# Patient Record
Sex: Male | Born: 1980 | Race: Black or African American | Hispanic: No | Marital: Single | Smoking: Former smoker
Health system: Southern US, Community
[De-identification: ages and names within clinical notes are randomized; demographics above are authoritative.]

## PROBLEM LIST (undated history)

## (undated) DIAGNOSIS — L0292 Furuncle, unspecified: Secondary | ICD-10-CM

## (undated) DIAGNOSIS — Z21 Asymptomatic human immunodeficiency virus [HIV] infection status: Secondary | ICD-10-CM

## (undated) DIAGNOSIS — R7989 Other specified abnormal findings of blood chemistry: Secondary | ICD-10-CM

## (undated) DIAGNOSIS — I10 Essential (primary) hypertension: Secondary | ICD-10-CM

## (undated) DIAGNOSIS — B20 Human immunodeficiency virus [HIV] disease: Secondary | ICD-10-CM

## (undated) HISTORY — PX: I & D EXTREMITY: SHX5045

## (undated) HISTORY — DX: Essential (primary) hypertension: I10

## (undated) HISTORY — DX: Other specified abnormal findings of blood chemistry: R79.89

## (undated) HISTORY — DX: Asymptomatic human immunodeficiency virus (hiv) infection status: Z21

## (undated) HISTORY — DX: Furuncle, unspecified: L02.92

## (undated) HISTORY — DX: Human immunodeficiency virus (HIV) disease: B20

---

## 2000-10-26 ENCOUNTER — Emergency Department (HOSPITAL_COMMUNITY): Admission: EM | Admit: 2000-10-26 | Discharge: 2000-10-26 | Payer: Self-pay | Admitting: Emergency Medicine

## 2002-06-08 ENCOUNTER — Emergency Department (HOSPITAL_COMMUNITY): Admission: EM | Admit: 2002-06-08 | Discharge: 2002-06-08 | Payer: Self-pay | Admitting: Emergency Medicine

## 2003-12-11 ENCOUNTER — Ambulatory Visit: Payer: Self-pay | Admitting: Infectious Diseases

## 2003-12-11 ENCOUNTER — Ambulatory Visit (HOSPITAL_COMMUNITY): Admission: RE | Admit: 2003-12-11 | Discharge: 2003-12-11 | Payer: Self-pay | Admitting: Infectious Diseases

## 2003-12-11 ENCOUNTER — Encounter (INDEPENDENT_AMBULATORY_CARE_PROVIDER_SITE_OTHER): Payer: Self-pay | Admitting: *Deleted

## 2003-12-11 LAB — CONVERTED CEMR LAB
CD4 Count: 370 microliters
CD4 T Cell Abs: 370

## 2003-12-27 ENCOUNTER — Ambulatory Visit: Payer: Self-pay | Admitting: Infectious Diseases

## 2004-01-03 ENCOUNTER — Ambulatory Visit: Payer: Self-pay | Admitting: Infectious Diseases

## 2004-01-09 ENCOUNTER — Ambulatory Visit: Payer: Self-pay | Admitting: Infectious Diseases

## 2004-09-25 ENCOUNTER — Ambulatory Visit: Payer: Self-pay | Admitting: Infectious Diseases

## 2004-09-25 ENCOUNTER — Ambulatory Visit (HOSPITAL_COMMUNITY): Admission: RE | Admit: 2004-09-25 | Discharge: 2004-09-25 | Payer: Self-pay | Admitting: Infectious Diseases

## 2004-11-04 ENCOUNTER — Ambulatory Visit: Payer: Self-pay | Admitting: Infectious Diseases

## 2005-01-28 ENCOUNTER — Ambulatory Visit: Payer: Self-pay | Admitting: Infectious Diseases

## 2005-01-28 ENCOUNTER — Ambulatory Visit (HOSPITAL_COMMUNITY): Admission: RE | Admit: 2005-01-28 | Discharge: 2005-01-28 | Payer: Self-pay | Admitting: Infectious Diseases

## 2005-06-04 ENCOUNTER — Ambulatory Visit: Payer: Self-pay | Admitting: Infectious Diseases

## 2005-08-18 ENCOUNTER — Encounter (INDEPENDENT_AMBULATORY_CARE_PROVIDER_SITE_OTHER): Payer: Self-pay | Admitting: *Deleted

## 2005-08-18 ENCOUNTER — Emergency Department (HOSPITAL_COMMUNITY): Admission: EM | Admit: 2005-08-18 | Discharge: 2005-08-18 | Payer: Self-pay | Admitting: Family Medicine

## 2005-08-18 ENCOUNTER — Ambulatory Visit: Payer: Self-pay | Admitting: Infectious Diseases

## 2005-08-18 ENCOUNTER — Encounter: Admission: RE | Admit: 2005-08-18 | Discharge: 2005-08-18 | Payer: Self-pay | Admitting: Infectious Diseases

## 2005-08-18 LAB — CONVERTED CEMR LAB
CD4 Count: 800 microliters
HIV 1 RNA Quant: 5970 copies/mL

## 2005-11-27 ENCOUNTER — Ambulatory Visit: Payer: Self-pay | Admitting: Infectious Diseases

## 2005-11-27 ENCOUNTER — Encounter (INDEPENDENT_AMBULATORY_CARE_PROVIDER_SITE_OTHER): Payer: Self-pay | Admitting: *Deleted

## 2005-11-27 ENCOUNTER — Encounter: Admission: RE | Admit: 2005-11-27 | Discharge: 2005-11-27 | Payer: Self-pay | Admitting: Infectious Diseases

## 2005-11-27 LAB — CONVERTED CEMR LAB: HIV 1 RNA Quant: 264 copies/mL

## 2006-02-18 ENCOUNTER — Ambulatory Visit: Payer: Self-pay | Admitting: Infectious Diseases

## 2006-03-10 DIAGNOSIS — B20 Human immunodeficiency virus [HIV] disease: Secondary | ICD-10-CM

## 2006-03-29 ENCOUNTER — Encounter: Payer: Self-pay | Admitting: Infectious Diseases

## 2006-04-07 ENCOUNTER — Ambulatory Visit: Payer: Self-pay | Admitting: Infectious Diseases

## 2006-04-07 ENCOUNTER — Encounter: Admission: RE | Admit: 2006-04-07 | Discharge: 2006-04-07 | Payer: Self-pay | Admitting: Infectious Diseases

## 2006-04-07 ENCOUNTER — Encounter (INDEPENDENT_AMBULATORY_CARE_PROVIDER_SITE_OTHER): Payer: Self-pay | Admitting: *Deleted

## 2006-04-07 LAB — CONVERTED CEMR LAB
ALT: 19 units/L (ref 0–53)
Alkaline Phosphatase: 51 units/L (ref 39–117)
Basophils Absolute: 0 10*3/uL (ref 0.0–0.1)
Basophils Relative: 0 % (ref 0–1)
CD4 Count: 700 microliters
Chloride: 102 meq/L (ref 96–112)
Creatinine, Ser: 0.95 mg/dL (ref 0.40–1.50)
Eosinophils Absolute: 0.2 10*3/uL (ref 0.0–0.7)
Eosinophils Relative: 2 % (ref 0–5)
Glucose, Bld: 82 mg/dL (ref 70–99)
HCT: 41.4 % (ref 39.0–52.0)
HIV 1 RNA Quant: 345 copies/mL
HIV-1 RNA Quant, Log: 2.54 — ABNORMAL HIGH (ref <1.70)
Lymphocytes Relative: 29 % (ref 12–46)
Lymphs Abs: 3 10*3/uL (ref 0.7–3.3)
MCHC: 35.7 g/dL (ref 30.0–36.0)
Neutro Abs: 6.3 10*3/uL (ref 1.7–7.7)
Potassium: 4.1 meq/L (ref 3.5–5.3)
Total Protein: 8 g/dL (ref 6.0–8.3)

## 2006-04-26 ENCOUNTER — Encounter (INDEPENDENT_AMBULATORY_CARE_PROVIDER_SITE_OTHER): Payer: Self-pay | Admitting: *Deleted

## 2006-04-26 LAB — CONVERTED CEMR LAB

## 2006-05-05 ENCOUNTER — Emergency Department (HOSPITAL_COMMUNITY): Admission: EM | Admit: 2006-05-05 | Discharge: 2006-05-05 | Payer: Self-pay | Admitting: Emergency Medicine

## 2006-05-09 ENCOUNTER — Encounter (INDEPENDENT_AMBULATORY_CARE_PROVIDER_SITE_OTHER): Payer: Self-pay | Admitting: *Deleted

## 2007-03-01 ENCOUNTER — Encounter: Payer: Self-pay | Admitting: Infectious Diseases

## 2007-03-09 ENCOUNTER — Telehealth: Payer: Self-pay | Admitting: Infectious Diseases

## 2007-04-04 ENCOUNTER — Encounter: Admission: RE | Admit: 2007-04-04 | Discharge: 2007-04-04 | Payer: Self-pay | Admitting: Internal Medicine

## 2007-04-04 ENCOUNTER — Ambulatory Visit: Payer: Self-pay | Admitting: Internal Medicine

## 2007-04-04 LAB — CONVERTED CEMR LAB
AST: 16 units/L (ref 0–37)
Albumin: 4.5 g/dL (ref 3.5–5.2)
BUN: 9 mg/dL (ref 6–23)
Basophils Absolute: 0 10*3/uL (ref 0.0–0.1)
Basophils Relative: 0 % (ref 0–1)
CO2: 24 meq/L (ref 19–32)
HIV 1 RNA Quant: 667 copies/mL — ABNORMAL HIGH (ref <50)
Lymphs Abs: 4.1 10*3/uL — ABNORMAL HIGH (ref 0.7–4.0)
MCHC: 36.3 g/dL — ABNORMAL HIGH (ref 30.0–36.0)
Monocytes Absolute: 0.6 10*3/uL (ref 0.1–1.0)
Neutrophils Relative %: 46 % (ref 43–77)
Potassium: 4.2 meq/L (ref 3.5–5.3)
RBC: 5.33 M/uL (ref 4.22–5.81)
Total Protein: 8.2 g/dL (ref 6.0–8.3)

## 2007-04-15 ENCOUNTER — Encounter (INDEPENDENT_AMBULATORY_CARE_PROVIDER_SITE_OTHER): Payer: Self-pay | Admitting: *Deleted

## 2007-04-29 ENCOUNTER — Ambulatory Visit: Payer: Self-pay | Admitting: Internal Medicine

## 2007-04-29 DIAGNOSIS — R21 Rash and other nonspecific skin eruption: Secondary | ICD-10-CM

## 2007-10-25 ENCOUNTER — Emergency Department (HOSPITAL_COMMUNITY): Admission: EM | Admit: 2007-10-25 | Discharge: 2007-10-25 | Payer: Self-pay | Admitting: Emergency Medicine

## 2007-10-25 ENCOUNTER — Telehealth (INDEPENDENT_AMBULATORY_CARE_PROVIDER_SITE_OTHER): Payer: Self-pay | Admitting: *Deleted

## 2007-10-28 ENCOUNTER — Ambulatory Visit: Payer: Self-pay | Admitting: Internal Medicine

## 2007-10-28 ENCOUNTER — Telehealth (INDEPENDENT_AMBULATORY_CARE_PROVIDER_SITE_OTHER): Payer: Self-pay | Admitting: *Deleted

## 2007-10-28 ENCOUNTER — Encounter (INDEPENDENT_AMBULATORY_CARE_PROVIDER_SITE_OTHER): Payer: Self-pay | Admitting: Licensed Clinical Social Worker

## 2007-10-28 DIAGNOSIS — A5139 Other secondary syphilis of skin: Secondary | ICD-10-CM

## 2007-10-28 LAB — CONVERTED CEMR LAB: HIV 1 RNA Quant: 4200 copies/mL — ABNORMAL HIGH (ref <50)

## 2007-10-31 ENCOUNTER — Telehealth: Payer: Self-pay

## 2007-11-01 ENCOUNTER — Telehealth: Payer: Self-pay | Admitting: Internal Medicine

## 2007-11-09 LAB — CONVERTED CEMR LAB
ALT: 19 units/L (ref 0–53)
Albumin: 3.7 g/dL (ref 3.5–5.2)
Alkaline Phosphatase: 61 units/L (ref 39–117)
Basophils Relative: 0 % (ref 0–1)
HCT: 41.7 % (ref 39.0–52.0)
Hemoglobin: 14 g/dL (ref 13.0–17.0)
MCV: 82.2 fL (ref 78.0–100.0)
Monocytes Relative: 11 % (ref 3–12)
Neutro Abs: 11.8 10*3/uL — ABNORMAL HIGH (ref 1.7–7.7)
Neutrophils Relative %: 64 % (ref 43–77)
Potassium: 3.9 meq/L (ref 3.5–5.3)
RBC: 5.07 M/uL (ref 4.22–5.81)
RDW: 13.6 % (ref 11.5–15.5)
Total Protein: 8.3 g/dL (ref 6.0–8.3)
WBC: 18.6 10*3/uL — ABNORMAL HIGH (ref 4.0–10.5)

## 2007-11-15 ENCOUNTER — Telehealth: Payer: Self-pay | Admitting: Internal Medicine

## 2007-11-15 ENCOUNTER — Telehealth: Payer: Self-pay

## 2007-11-16 ENCOUNTER — Encounter: Payer: Self-pay | Admitting: Internal Medicine

## 2007-11-18 ENCOUNTER — Telehealth: Payer: Self-pay | Admitting: Internal Medicine

## 2007-11-18 ENCOUNTER — Ambulatory Visit: Payer: Self-pay | Admitting: Internal Medicine

## 2007-11-18 DIAGNOSIS — F411 Generalized anxiety disorder: Secondary | ICD-10-CM | POA: Insufficient documentation

## 2007-11-22 ENCOUNTER — Telehealth (INDEPENDENT_AMBULATORY_CARE_PROVIDER_SITE_OTHER): Payer: Self-pay | Admitting: *Deleted

## 2007-11-24 ENCOUNTER — Telehealth (INDEPENDENT_AMBULATORY_CARE_PROVIDER_SITE_OTHER): Payer: Self-pay | Admitting: *Deleted

## 2007-11-24 ENCOUNTER — Encounter: Payer: Self-pay | Admitting: Internal Medicine

## 2007-12-01 ENCOUNTER — Telehealth (INDEPENDENT_AMBULATORY_CARE_PROVIDER_SITE_OTHER): Payer: Self-pay | Admitting: *Deleted

## 2007-12-06 ENCOUNTER — Telehealth: Payer: Self-pay

## 2008-02-17 ENCOUNTER — Ambulatory Visit: Payer: Self-pay | Admitting: Internal Medicine

## 2008-02-17 DIAGNOSIS — R197 Diarrhea, unspecified: Secondary | ICD-10-CM

## 2008-02-17 LAB — CONVERTED CEMR LAB
AST: 27 units/L (ref 0–37)
Albumin: 4.5 g/dL (ref 3.5–5.2)
Alkaline Phosphatase: 82 units/L (ref 39–117)
BUN: 12 mg/dL (ref 6–23)
Basophils Absolute: 0 10*3/uL (ref 0.0–0.1)
Basophils Relative: 0 % (ref 0–1)
Calcium: 9.5 mg/dL (ref 8.4–10.5)
Chloride: 102 meq/L (ref 96–112)
Creatinine, Ser: 1.04 mg/dL (ref 0.40–1.50)
Eosinophils Absolute: 0.2 10*3/uL (ref 0.0–0.7)
Glucose, Bld: 101 mg/dL — ABNORMAL HIGH (ref 70–99)
HCT: 43.6 % (ref 39.0–52.0)
HIV 1 RNA Quant: 1140 copies/mL — ABNORMAL HIGH (ref <48)
HIV-1 RNA Quant, Log: 3.06 — ABNORMAL HIGH (ref <1.68)
Monocytes Absolute: 0.9 10*3/uL (ref 0.1–1.0)
Potassium: 4.6 meq/L (ref 3.5–5.3)
WBC: 12.2 10*3/uL — ABNORMAL HIGH (ref 4.0–10.5)

## 2008-07-18 ENCOUNTER — Telehealth (INDEPENDENT_AMBULATORY_CARE_PROVIDER_SITE_OTHER): Payer: Self-pay | Admitting: *Deleted

## 2008-07-23 ENCOUNTER — Ambulatory Visit: Payer: Self-pay | Admitting: Internal Medicine

## 2008-07-23 LAB — CONVERTED CEMR LAB
BUN: 9 mg/dL (ref 6–23)
CO2: 23 meq/L (ref 19–32)
Calcium: 10.1 mg/dL (ref 8.4–10.5)
Chloride: 99 meq/L (ref 96–112)
GFR calc Af Amer: >60 mL/min (ref >60)
GFR calc non Af Amer: >60 mL/min (ref >60)
HCT: 42.6 % (ref 39.0–52.0)
Lymphs Abs: 4.2 10*3/uL — ABNORMAL HIGH (ref 0.7–4.0)
MCHC: 35.4 g/dL (ref 30.0–36.0)
MCV: 76.1 fL — ABNORMAL LOW (ref 78.0–100.0)
Monocytes Absolute: 0.9 10*3/uL (ref 0.1–1.0)
Potassium: 4.3 meq/L (ref 3.5–5.3)
RBC: 5.6 M/uL (ref 4.22–5.81)

## 2008-08-10 ENCOUNTER — Encounter: Payer: Self-pay | Admitting: Licensed Clinical Social Worker

## 2008-08-10 ENCOUNTER — Ambulatory Visit: Payer: Self-pay | Admitting: Internal Medicine

## 2008-08-10 DIAGNOSIS — I10 Essential (primary) hypertension: Secondary | ICD-10-CM | POA: Insufficient documentation

## 2009-02-06 ENCOUNTER — Ambulatory Visit: Payer: Self-pay | Admitting: Internal Medicine

## 2009-02-06 LAB — CONVERTED CEMR LAB
ALT: 14 units/L (ref 0–53)
BUN: 12 mg/dL (ref 6–23)
Basophils Absolute: 0 10*3/uL (ref 0.0–0.1)
Chloride: 100 meq/L (ref 96–112)
Creatinine, Ser: 0.99 mg/dL (ref 0.40–1.50)
Glucose, Bld: 85 mg/dL (ref 70–99)
HIV-1 RNA Quant, Log: 2.38 — ABNORMAL HIGH (ref <1.68)
Monocytes Absolute: 0.4 10*3/uL (ref 0.1–1.0)
Neutro Abs: 6.3 10*3/uL (ref 1.7–7.7)
Potassium: 4.1 meq/L (ref 3.5–5.3)
RDW: 14.3 % (ref 11.5–15.5)
Total Bilirubin: 0.4 mg/dL (ref 0.3–1.2)
WBC: 12.5 10*3/uL — ABNORMAL HIGH (ref 4.0–10.5)

## 2009-02-19 ENCOUNTER — Ambulatory Visit: Payer: Self-pay | Admitting: Internal Medicine

## 2010-06-03 LAB — T-HELPER CELL (CD4) - (RCID CLINIC ONLY)
CD4 % Helper T Cell: 25 % — ABNORMAL LOW (ref 33–55)
CD4 T Cell Abs: 1100 uL (ref 400–2700)

## 2010-06-10 LAB — T-HELPER CELL (CD4) - (RCID CLINIC ONLY)
CD4 % Helper T Cell: 26 % — ABNORMAL LOW (ref 33–55)
CD4 T Cell Abs: 1360 uL (ref 400–2700)

## 2010-11-18 ENCOUNTER — Telehealth: Payer: Self-pay | Admitting: *Deleted

## 2010-11-18 NOTE — Telephone Encounter (Signed)
I lm for him to call back & press 2 to get the staff who will make him an appt for labs, Britta Mccreedy & md

## 2010-11-21 LAB — T-HELPER CELL (CD4) - (RCID CLINIC ONLY)
CD4 % Helper T Cell: 24 — ABNORMAL LOW
CD4 T Cell Abs: 940

## 2010-11-24 ENCOUNTER — Other Ambulatory Visit (INDEPENDENT_AMBULATORY_CARE_PROVIDER_SITE_OTHER): Payer: Self-pay

## 2010-11-24 DIAGNOSIS — B20 Human immunodeficiency virus [HIV] disease: Secondary | ICD-10-CM

## 2010-11-24 DIAGNOSIS — Z113 Encounter for screening for infections with a predominantly sexual mode of transmission: Secondary | ICD-10-CM

## 2010-11-24 DIAGNOSIS — I10 Essential (primary) hypertension: Secondary | ICD-10-CM

## 2010-11-24 LAB — COMPREHENSIVE METABOLIC PANEL
ALT: 20 U/L (ref 0–53)
AST: 25 U/L (ref 0–37)
Albumin: 4.5 g/dL (ref 3.5–5.2)
Total Protein: 8.5 g/dL — ABNORMAL HIGH (ref 6.0–8.3)

## 2010-11-24 LAB — URINALYSIS, ROUTINE W REFLEX MICROSCOPIC
Ketones, ur: NEGATIVE mg/dL
Protein, ur: NEGATIVE mg/dL
Specific Gravity, Urine: 1.015 (ref 1.005–1.030)
Urobilinogen, UA: 0.2 mg/dL (ref 0.0–1.0)
pH: 6.5 (ref 5.0–8.0)

## 2010-11-24 LAB — CBC WITH DIFFERENTIAL/PLATELET
Basophils Relative: 1 % (ref 0–1)
Eosinophils Relative: 13 % — ABNORMAL HIGH (ref 0–5)
HCT: 42.8 % (ref 39.0–52.0)
MCH: 27.9 pg (ref 26.0–34.0)
MCV: 77 fL — ABNORMAL LOW (ref 78.0–100.0)
Monocytes Relative: 5 % (ref 3–12)
Neutrophils Relative %: 42 % — ABNORMAL LOW (ref 43–77)
RBC: 5.56 MIL/uL (ref 4.22–5.81)
WBC: 12.5 10*3/uL — ABNORMAL HIGH (ref 4.0–10.5)

## 2010-11-26 LAB — HIV-1 RNA QUANT-NO REFLEX-BLD
HIV 1 RNA Quant: 865 copies/mL — ABNORMAL HIGH (ref <20)
HIV-1 RNA Quant, Log: 2.94 {Log} — ABNORMAL HIGH (ref <1.30)

## 2010-12-05 LAB — T-HELPER CELL (CD4) - (RCID CLINIC ONLY): CD4 T Cell Abs: 1090 uL (ref 400–2700)

## 2010-12-08 ENCOUNTER — Encounter: Payer: Self-pay | Admitting: Infectious Disease

## 2010-12-08 ENCOUNTER — Ambulatory Visit (INDEPENDENT_AMBULATORY_CARE_PROVIDER_SITE_OTHER): Payer: PRIVATE HEALTH INSURANCE | Admitting: Infectious Disease

## 2010-12-08 VITALS — BP 153/101 | HR 81 | Temp 97.5°F | Wt 184.0 lb

## 2010-12-08 DIAGNOSIS — B20 Human immunodeficiency virus [HIV] disease: Secondary | ICD-10-CM

## 2010-12-08 DIAGNOSIS — Z23 Encounter for immunization: Secondary | ICD-10-CM

## 2010-12-08 DIAGNOSIS — A5139 Other secondary syphilis of skin: Secondary | ICD-10-CM

## 2010-12-08 DIAGNOSIS — I10 Essential (primary) hypertension: Secondary | ICD-10-CM

## 2010-12-08 MED ORDER — HYDROCHLOROTHIAZIDE 25 MG PO TABS: 25.0000 mg | ORAL_TABLET | Freq: Every day | ORAL | Status: DC

## 2010-12-08 NOTE — Assessment & Plan Note (Signed)
Start hydrochlorothiazide

## 2010-12-08 NOTE — Assessment & Plan Note (Signed)
He wishes to defer therapy and this is reasonable for now.

## 2010-12-08 NOTE — Progress Notes (Signed)
  Subjective:    Patient ID: Clayton Erickson, male    DOB: 1980-11-24, 30 y.o.   MRN: [id number]  HPI 30 year old African American male with HIV who is a apparent long-term nonprogressive or with low viral load varying pain at 6 800 copies up to 4000. His CD4 counts are quite healthy and most recently checked was 10 greater than thousand. Discussed options for treatment as well as the Department of Health and human services recommendations for treatment for HIV infection. I feel is reasonable to continue to observe the patient off antivirals given his low viral load. I also did offer him enrollment into the clinical trial START however he really just wanted to defer therapy the time being. He does have sexual intercourse with another man who is HIV negative as his partner. They use condoms with intercourse. His partner is also regular tested. Patient also has elevated blood pressure and I reviewed with him indications for initiating antihypertensive medications.  Review of Systems  Constitutional: Negative for fever, chills, diaphoresis, activity change, appetite change, fatigue and unexpected weight change.  HENT: Negative for congestion, sore throat, rhinorrhea, sneezing, trouble swallowing and sinus pressure.   Eyes: Negative for photophobia and visual disturbance.  Respiratory: Negative for cough, chest tightness, shortness of breath, wheezing and stridor.   Cardiovascular: Negative for chest pain, palpitations and leg swelling.  Gastrointestinal: Negative for nausea, vomiting, abdominal pain, diarrhea, constipation, blood in stool, abdominal distention and anal bleeding.  Genitourinary: Negative for dysuria, hematuria, flank pain and difficulty urinating.  Musculoskeletal: Negative for myalgias, back pain, joint swelling, arthralgias and gait problem.  Skin: Negative for color change, pallor, rash and wound.  Neurological: Negative for dizziness, tremors, weakness and light-headedness.    Hematological: Negative for adenopathy. Does not bruise/bleed easily.  Psychiatric/Behavioral: Negative for behavioral problems, confusion, sleep disturbance, dysphoric mood, decreased concentration and agitation.       Objective:   Physical Exam  Constitutional: He is oriented to person, place, and time. He appears well-developed and well-nourished. No distress.  HENT:  Head: Normocephalic and atraumatic.  Mouth/Throat: Oropharyngeal exudate present.  Eyes: Conjunctivae and EOM are normal. Pupils are equal, round, and reactive to light. No scleral icterus.  Neck: Normal range of motion. Neck supple. No JVD present.  Cardiovascular: Normal rate, regular rhythm and normal heart sounds.  Exam reveals no gallop and no friction rub.   No murmur heard. Pulmonary/Chest: Effort normal and breath sounds normal. No respiratory distress. He has no wheezes. He has no rales. He exhibits no tenderness.  Abdominal: He exhibits no distension and no mass. There is no tenderness. There is no rebound and no guarding.  Musculoskeletal: He exhibits no edema and no tenderness.  Lymphadenopathy:    He has no cervical adenopathy.  Neurological: He is alert and oriented to person, place, and time. He has normal reflexes. He exhibits normal muscle tone. Coordination normal.  Skin: Skin is warm and dry. He is not diaphoretic. No erythema. No pallor.  Psychiatric: He has a normal mood and affect. His behavior is normal. Judgment and thought content normal.          Assessment & Plan:  HIV DISEASE He wishes to defer therapy and this is reasonable for now.  SECONDARY SYPHILIS OF SKIN OR MUCOUS MEMBRANES Recheck serum RPR with regular visits.  ESSENTIAL HYPERTENSION, BENIGN Start hydrochlorothiazide

## 2010-12-08 NOTE — Assessment & Plan Note (Signed)
Recheck serum RPR with regular visits.

## 2010-12-16 ENCOUNTER — Other Ambulatory Visit: Payer: Self-pay | Admitting: *Deleted

## 2010-12-16 DIAGNOSIS — I10 Essential (primary) hypertension: Secondary | ICD-10-CM

## 2010-12-16 MED ORDER — HYDROCHLOROTHIAZIDE 25 MG PO TABS: 25.0000 mg | ORAL_TABLET | Freq: Every day | ORAL | Status: DC

## 2010-12-16 NOTE — Telephone Encounter (Signed)
Rx was sent to the wrong Pharmacy. So was resent to the correct pharmacy.

## 2011-04-28 ENCOUNTER — Ambulatory Visit: Payer: PRIVATE HEALTH INSURANCE

## 2011-05-15 ENCOUNTER — Other Ambulatory Visit: Payer: Self-pay | Admitting: *Deleted

## 2011-05-15 DIAGNOSIS — Z7721 Contact with and (suspected) exposure to potentially hazardous body fluids: Secondary | ICD-10-CM

## 2011-05-27 ENCOUNTER — Telehealth: Payer: Self-pay | Admitting: *Deleted

## 2011-05-27 ENCOUNTER — Encounter: Payer: Self-pay | Admitting: *Deleted

## 2011-05-27 ENCOUNTER — Other Ambulatory Visit (INDEPENDENT_AMBULATORY_CARE_PROVIDER_SITE_OTHER): Payer: Self-pay

## 2011-05-27 DIAGNOSIS — B20 Human immunodeficiency virus [HIV] disease: Secondary | ICD-10-CM

## 2011-05-27 DIAGNOSIS — Z7721 Contact with and (suspected) exposure to potentially hazardous body fluids: Secondary | ICD-10-CM

## 2011-05-27 DIAGNOSIS — Z113 Encounter for screening for infections with a predominantly sexual mode of transmission: Secondary | ICD-10-CM

## 2011-05-27 DIAGNOSIS — Z79899 Other long term (current) drug therapy: Secondary | ICD-10-CM

## 2011-05-27 DIAGNOSIS — R21 Rash and other nonspecific skin eruption: Secondary | ICD-10-CM

## 2011-05-27 LAB — LIPID PANEL
HDL: 79 mg/dL (ref >39)
LDL Cholesterol: 109 mg/dL — ABNORMAL HIGH (ref 0–99)
Total CHOL/HDL Ratio: 2.6 Ratio
Triglycerides: 98 mg/dL (ref <150)
VLDL: 20 mg/dL (ref 0–40)

## 2011-05-27 LAB — CBC WITH DIFFERENTIAL/PLATELET
Basophils Absolute: 0.1 10*3/uL (ref 0.0–0.1)
Basophils Relative: 0 % (ref 0–1)
Hemoglobin: 14.9 g/dL (ref 13.0–17.0)
Lymphocytes Relative: 35 % (ref 12–46)
MCHC: 36.3 g/dL — ABNORMAL HIGH (ref 30.0–36.0)
Monocytes Relative: 6 % (ref 3–12)
Neutro Abs: 5.6 10*3/uL (ref 1.7–7.7)
Neutrophils Relative %: 44 % (ref 43–77)
RDW: 14.5 % (ref 11.5–15.5)
WBC: 12.6 10*3/uL — ABNORMAL HIGH (ref 4.0–10.5)

## 2011-05-27 LAB — COMPLETE METABOLIC PANEL WITH GFR
AST: 23 U/L (ref 0–37)
Albumin: 4.5 g/dL (ref 3.5–5.2)
Alkaline Phosphatase: 57 U/L (ref 39–117)
Chloride: 101 mEq/L (ref 96–112)
Potassium: 4.3 mEq/L (ref 3.5–5.3)
Sodium: 135 mEq/L (ref 135–145)
Total Protein: 8.5 g/dL — ABNORMAL HIGH (ref 6.0–8.3)

## 2011-05-27 NOTE — Telephone Encounter (Signed)
Pt's partner seen by his PCP and diagnosed with syphilis.  Pt wanting to come in for lab work today.  Pt rescheduled for today for lab work.  Pt verbalized understanding.

## 2011-06-03 ENCOUNTER — Other Ambulatory Visit: Payer: PRIVATE HEALTH INSURANCE

## 2011-06-04 ENCOUNTER — Telehealth: Payer: Self-pay | Admitting: *Deleted

## 2011-06-04 DIAGNOSIS — Z113 Encounter for screening for infections with a predominantly sexual mode of transmission: Secondary | ICD-10-CM

## 2011-06-04 NOTE — Telephone Encounter (Signed)
Clayton Erickson from the lab called to say the urine vial that was submitted was overfilled & they had to reject it. I spoke with Clayton Erickson in the lab & she states it was routine testing. He will be in later this month & we can get another then. Order entered

## 2011-06-17 ENCOUNTER — Ambulatory Visit: Payer: Self-pay

## 2011-06-17 ENCOUNTER — Telehealth: Payer: Self-pay | Admitting: Licensed Clinical Social Worker

## 2011-06-17 ENCOUNTER — Ambulatory Visit (INDEPENDENT_AMBULATORY_CARE_PROVIDER_SITE_OTHER): Payer: Self-pay | Admitting: Infectious Disease

## 2011-06-17 ENCOUNTER — Encounter: Payer: Self-pay | Admitting: Infectious Disease

## 2011-06-17 VITALS — BP 168/101 | HR 92 | Temp 97.5°F | Ht 71.0 in | Wt 194.0 lb

## 2011-06-17 DIAGNOSIS — Z113 Encounter for screening for infections with a predominantly sexual mode of transmission: Secondary | ICD-10-CM

## 2011-06-17 DIAGNOSIS — L739 Follicular disorder, unspecified: Secondary | ICD-10-CM

## 2011-06-17 DIAGNOSIS — I10 Essential (primary) hypertension: Secondary | ICD-10-CM

## 2011-06-17 DIAGNOSIS — A5139 Other secondary syphilis of skin: Secondary | ICD-10-CM

## 2011-06-17 DIAGNOSIS — H6120 Impacted cerumen, unspecified ear: Secondary | ICD-10-CM | POA: Insufficient documentation

## 2011-06-17 DIAGNOSIS — B20 Human immunodeficiency virus [HIV] disease: Secondary | ICD-10-CM

## 2011-06-17 MED ORDER — PENICILLIN G BENZATHINE 1200000 UNIT/2ML IM SUSP
1.2000 10*6.[IU] | Freq: Once | INTRAMUSCULAR | Status: AC
  Administered 2011-06-17: 1.2 10*6.[IU] via INTRAMUSCULAR

## 2011-06-17 MED ORDER — PENICILLIN G BENZATHINE 1200000 UNIT/2ML IM SUSP: 2.4000 10*6.[IU] | Freq: Once | INTRAMUSCULAR | Status: DC

## 2011-06-17 MED ORDER — DOXYCYCLINE HYCLATE 100 MG PO CAPS: 100.0000 mg | ORAL_CAPSULE | Freq: Two times a day (BID) | ORAL | Status: DC

## 2011-06-17 NOTE — Assessment & Plan Note (Signed)
More likely is follicultis. Doxy

## 2011-06-17 NOTE — Assessment & Plan Note (Signed)
I will check RPR again adn give him one time dose of 2.4 MU of PCN. He had JH rxn before

## 2011-06-17 NOTE — Assessment & Plan Note (Signed)
Better controlled 

## 2011-06-17 NOTE — Assessment & Plan Note (Signed)
Still wants to defer rx

## 2011-06-17 NOTE — Progress Notes (Signed)
  Subjective:    Patient ID: Clayton Erickson, male    DOB: 01-29-1981, 31 y.o.   MRN: [id number]  HPI  31 year old long term nonprogressor with viral loads in hundreds to few k range and cd4 above 1k. I have offered him enrollment in START, ACTG 5303 vs starting therapy and he prefers to simply continue to defer therapy. He claims to use condoms with ALL penile and rectal intercourse but not with oral sex. His partner tested positive for syphilis and he is concerned he may now have it based on lesion on scrotum that actually looks like infected hair follicle. He also co ear wax build up. I spent greater than 45 minutes with the patient including greater than 50% of time in face to face counsel of the patient and in coordination of their care.   Review of Systems  Constitutional: Negative for fever, chills, diaphoresis, activity change, appetite change, fatigue and unexpected weight change.  HENT: Negative for congestion, sore throat, rhinorrhea, sneezing, trouble swallowing and sinus pressure.   Eyes: Negative for photophobia and visual disturbance.  Respiratory: Negative for cough, chest tightness, shortness of breath, wheezing and stridor.   Cardiovascular: Negative for chest pain, palpitations and leg swelling.  Gastrointestinal: Negative for nausea, vomiting, abdominal pain, diarrhea, constipation, blood in stool, abdominal distention and anal bleeding.  Genitourinary: Negative for dysuria, hematuria, flank pain and difficulty urinating.  Musculoskeletal: Negative for myalgias, back pain, joint swelling, arthralgias and gait problem.  Skin: Positive for rash. Negative for color change, pallor and wound.  Neurological: Negative for dizziness, tremors, weakness and light-headedness.  Hematological: Negative for adenopathy. Does not bruise/bleed easily.  Psychiatric/Behavioral: Negative for behavioral problems, confusion, sleep disturbance, dysphoric mood, decreased concentration and agitation.        Objective:   Physical Exam  Constitutional: He is oriented to person, place, and time. He appears well-developed and well-nourished. No distress.  HENT:  Head: Normocephalic and atraumatic.  Ears:  Mouth/Throat: Oropharynx is clear and moist. No oropharyngeal exudate.  Eyes: Conjunctivae and EOM are normal. Pupils are equal, round, and reactive to light. No scleral icterus.  Neck: Normal range of motion. Neck supple. No JVD present.  Cardiovascular: Normal rate, regular rhythm and normal heart sounds.  Exam reveals no gallop and no friction rub.   No murmur heard. Pulmonary/Chest: Effort normal and breath sounds normal. No respiratory distress. He has no wheezes. He has no rales. He exhibits no tenderness.  Abdominal: He exhibits no distension and no mass. There is no tenderness. There is no rebound and no guarding.  Genitourinary:     Musculoskeletal: He exhibits no edema and no tenderness.  Lymphadenopathy:    He has no cervical adenopathy.  Neurological: He is alert and oriented to person, place, and time. He has normal reflexes. He exhibits normal muscle tone. Coordination normal.  Skin: Skin is warm and dry. He is not diaphoretic. No erythema. No pallor.  Psychiatric: He has a normal mood and affect. His behavior is normal. Judgment and thought content normal.          Assessment & Plan:  SECONDARY SYPHILIS OF SKIN OR MUCOUS MEMBRANES I will check RPR again adn give him one time dose of 2.4 MU of PCN. He had JH rxn before  Folliculitis More likely is follicultis. Doxy  ESSENTIAL HYPERTENSION, BENIGN Better controlled  HIV DISEASE Still wants to defer rx  Ear build-up Irrigate canals

## 2011-06-17 NOTE — Assessment & Plan Note (Signed)
Irrigate canals

## 2011-06-17 NOTE — Telephone Encounter (Signed)
Error

## 2011-06-23 ENCOUNTER — Other Ambulatory Visit: Payer: Self-pay | Admitting: *Deleted

## 2011-06-23 ENCOUNTER — Ambulatory Visit: Payer: Self-pay

## 2011-06-23 DIAGNOSIS — L739 Follicular disorder, unspecified: Secondary | ICD-10-CM

## 2011-06-23 DIAGNOSIS — I10 Essential (primary) hypertension: Secondary | ICD-10-CM

## 2011-06-23 MED ORDER — DOXYCYCLINE HYCLATE 100 MG PO CAPS: 100.0000 mg | ORAL_CAPSULE | Freq: Two times a day (BID) | ORAL | Status: AC

## 2011-06-23 MED ORDER — HYDROCHLOROTHIAZIDE 25 MG PO TABS: 25.0000 mg | ORAL_TABLET | Freq: Every day | ORAL | Status: DC

## 2012-01-07 ENCOUNTER — Ambulatory Visit: Payer: Self-pay

## 2012-01-07 ENCOUNTER — Other Ambulatory Visit: Payer: Self-pay

## 2012-01-22 ENCOUNTER — Ambulatory Visit: Payer: Self-pay

## 2012-01-22 ENCOUNTER — Ambulatory Visit: Payer: Self-pay | Admitting: Infectious Disease

## 2012-01-22 ENCOUNTER — Telehealth: Payer: Self-pay | Admitting: *Deleted

## 2012-01-22 NOTE — Telephone Encounter (Signed)
Called and left patient a voicemail to reschedule his lab and MD visit, he no showed both.  Also referred to [facility] for multiple no shows. Wendall Mola CMA

## 2012-02-16 ENCOUNTER — Other Ambulatory Visit (INDEPENDENT_AMBULATORY_CARE_PROVIDER_SITE_OTHER): Payer: Self-pay

## 2012-02-16 ENCOUNTER — Ambulatory Visit: Payer: Self-pay

## 2012-02-16 DIAGNOSIS — Z113 Encounter for screening for infections with a predominantly sexual mode of transmission: Secondary | ICD-10-CM

## 2012-02-16 DIAGNOSIS — B20 Human immunodeficiency virus [HIV] disease: Secondary | ICD-10-CM

## 2012-02-16 DIAGNOSIS — A5139 Other secondary syphilis of skin: Secondary | ICD-10-CM

## 2012-02-16 DIAGNOSIS — Z79899 Other long term (current) drug therapy: Secondary | ICD-10-CM

## 2012-02-16 LAB — CBC WITH DIFFERENTIAL/PLATELET
Basophils Relative: 0 % (ref 0–1)
Eosinophils Absolute: 0.6 10*3/uL (ref 0.0–0.7)
Eosinophils Relative: 6 % — ABNORMAL HIGH (ref 0–5)
Hemoglobin: 15.4 g/dL (ref 13.0–17.0)
Lymphs Abs: 3.6 10*3/uL (ref 0.7–4.0)
MCH: 27.5 pg (ref 26.0–34.0)
MCHC: 35.4 g/dL (ref 30.0–36.0)
MCV: 77.8 fL — ABNORMAL LOW (ref 78.0–100.0)
Monocytes Absolute: 0.8 10*3/uL (ref 0.1–1.0)
Monocytes Relative: 8 % (ref 3–12)
Neutrophils Relative %: 50 % (ref 43–77)
RBC: 5.59 MIL/uL (ref 4.22–5.81)

## 2012-02-17 LAB — COMPLETE METABOLIC PANEL WITH GFR
Alkaline Phosphatase: 59 U/L (ref 39–117)
BUN: 11 mg/dL (ref 6–23)
CO2: 27 mEq/L (ref 19–32)
Creat: 1.12 mg/dL (ref 0.50–1.35)
GFR, Est African American: >89 mL/min
GFR, Est Non African American: 86 mL/min
Glucose, Bld: 79 mg/dL (ref 70–99)
Sodium: 134 mEq/L — ABNORMAL LOW (ref 135–145)
Total Bilirubin: 0.7 mg/dL (ref 0.3–1.2)
Total Protein: 8.4 g/dL — ABNORMAL HIGH (ref 6.0–8.3)

## 2012-02-17 LAB — RPR

## 2012-02-17 LAB — LIPID PANEL
Cholesterol: 181 mg/dL (ref 0–200)
Triglycerides: 54 mg/dL (ref <150)
VLDL: 11 mg/dL (ref 0–40)

## 2012-02-17 LAB — T-HELPER CELL (CD4) - (RCID CLINIC ONLY): CD4 % Helper T Cell: 24 % — ABNORMAL LOW (ref 33–55)

## 2012-02-18 LAB — HIV-1 RNA QUANT-NO REFLEX-BLD
HIV 1 RNA Quant: 837 copies/mL — ABNORMAL HIGH (ref <20)
HIV-1 RNA Quant, Log: 2.92 {Log} — ABNORMAL HIGH (ref <1.30)

## 2012-03-07 ENCOUNTER — Ambulatory Visit: Payer: Self-pay | Admitting: Infectious Disease

## 2012-03-14 ENCOUNTER — Ambulatory Visit: Payer: Self-pay | Admitting: Infectious Disease

## 2012-03-15 ENCOUNTER — Ambulatory Visit (INDEPENDENT_AMBULATORY_CARE_PROVIDER_SITE_OTHER): Payer: Self-pay | Admitting: Infectious Disease

## 2012-03-15 VITALS — BP 163/97 | HR 94 | Temp 97.6°F | Ht 71.0 in | Wt 197.0 lb

## 2012-03-15 DIAGNOSIS — A4902 Methicillin resistant Staphylococcus aureus infection, unspecified site: Secondary | ICD-10-CM

## 2012-03-15 DIAGNOSIS — L738 Other specified follicular disorders: Secondary | ICD-10-CM

## 2012-03-15 DIAGNOSIS — B20 Human immunodeficiency virus [HIV] disease: Secondary | ICD-10-CM

## 2012-03-15 DIAGNOSIS — F411 Generalized anxiety disorder: Secondary | ICD-10-CM

## 2012-03-15 DIAGNOSIS — L678 Other hair color and hair shaft abnormalities: Secondary | ICD-10-CM

## 2012-03-15 DIAGNOSIS — Z653 Problems related to other legal circumstances: Secondary | ICD-10-CM

## 2012-03-15 DIAGNOSIS — L0292 Furuncle, unspecified: Secondary | ICD-10-CM

## 2012-03-15 DIAGNOSIS — L0293 Carbuncle, unspecified: Secondary | ICD-10-CM

## 2012-03-15 DIAGNOSIS — I10 Essential (primary) hypertension: Secondary | ICD-10-CM

## 2012-03-15 DIAGNOSIS — IMO0001 Reserved for inherently not codable concepts without codable children: Secondary | ICD-10-CM

## 2012-03-15 DIAGNOSIS — L739 Follicular disorder, unspecified: Secondary | ICD-10-CM

## 2012-03-15 MED ORDER — SULFAMETHOXAZOLE-TMP DS 800-160 MG PO TABS: 1.0000 | ORAL_TABLET | Freq: Two times a day (BID) | ORAL | Status: DC

## 2012-03-15 MED ORDER — CLONAZEPAM 0.5 MG PO TABS: 0.5000 mg | ORAL_TABLET | Freq: Every day | ORAL | Status: DC | PRN

## 2012-03-15 MED ORDER — DOXYCYCLINE HYCLATE 100 MG PO TABS: 100.0000 mg | ORAL_TABLET | Freq: Two times a day (BID) | ORAL | Status: DC

## 2012-03-15 MED ORDER — ESCITALOPRAM OXALATE 20 MG PO TABS: 20.0000 mg | ORAL_TABLET | Freq: Every day | ORAL | Status: DC

## 2012-03-15 MED ORDER — EMTRICITAB-RILPIVIR-TENOFOV DF 200-25-300 MG PO TABS: 1.0000 | ORAL_TABLET | Freq: Every day | ORAL | Status: DC

## 2012-03-15 NOTE — Patient Instructions (Addendum)
Take doxycycline 100mg  twice daily for 2 weeks  Apply warm compresses to the areas four times a day  Please make appt with Kandice Robinsons to enroll in ADAP so that you can get Complera to start   Complera MUST be taken with a meal with at least 400 calories that contains fat and  that is EATEN  DO NOT TAKE PRONTONIX PRILOSEC OR ANY PROTON PUMP INHIBITORS WHILE ONT HIS MEDICINE  ALL OTHER ANTACIDS MUST BE TAKEN 12 HOURS APART FROM YOUR COMPLERA DOSE
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
## 2012-03-18 LAB — WOUND CULTURE
Gram Stain: NONE SEEN
Gram Stain: NONE SEEN
Organism ID, Bacteria: NO GROWTH

## 2012-04-06 ENCOUNTER — Other Ambulatory Visit: Payer: Self-pay | Admitting: *Deleted

## 2012-04-06 ENCOUNTER — Telehealth: Payer: Self-pay | Admitting: *Deleted

## 2012-04-06 DIAGNOSIS — F411 Generalized anxiety disorder: Secondary | ICD-10-CM

## 2012-04-06 DIAGNOSIS — L739 Follicular disorder, unspecified: Secondary | ICD-10-CM

## 2012-04-06 DIAGNOSIS — B20 Human immunodeficiency virus [HIV] disease: Secondary | ICD-10-CM

## 2012-04-06 DIAGNOSIS — I10 Essential (primary) hypertension: Secondary | ICD-10-CM

## 2012-04-06 MED ORDER — HYDROCHLOROTHIAZIDE 25 MG PO TABS: 25.0000 mg | ORAL_TABLET | Freq: Every day | ORAL | Status: DC

## 2012-04-06 MED ORDER — EMTRICITAB-RILPIVIR-TENOFOV DF 200-25-300 MG PO TABS: 1.0000 | ORAL_TABLET | Freq: Every day | ORAL | Status: DC

## 2012-04-06 MED ORDER — SULFAMETHOXAZOLE-TMP DS 800-160 MG PO TABS: 1.0000 | ORAL_TABLET | Freq: Two times a day (BID) | ORAL | Status: DC

## 2012-04-06 MED ORDER — ESCITALOPRAM OXALATE 20 MG PO TABS: 20.0000 mg | ORAL_TABLET | Freq: Every day | ORAL | Status: DC

## 2012-04-06 NOTE — Telephone Encounter (Signed)
Phone number listed is out-of-service.  ADAP approval received.  Pt needing to contact [facility] about his ADAP rxes.  Pt needs to call [facility] @ [phone] to arrange how and where to pick up ADAP rxes

## 2012-04-06 NOTE — Telephone Encounter (Signed)
Patient ADAP was approved. 

## 2012-06-29 ENCOUNTER — Other Ambulatory Visit: Payer: Self-pay

## 2012-07-11 ENCOUNTER — Encounter: Payer: Self-pay | Admitting: *Deleted

## 2012-07-12 ENCOUNTER — Other Ambulatory Visit (INDEPENDENT_AMBULATORY_CARE_PROVIDER_SITE_OTHER): Payer: Self-pay

## 2012-07-12 ENCOUNTER — Other Ambulatory Visit: Payer: Self-pay | Admitting: Infectious Disease

## 2012-07-12 DIAGNOSIS — Z113 Encounter for screening for infections with a predominantly sexual mode of transmission: Secondary | ICD-10-CM

## 2012-07-12 DIAGNOSIS — B20 Human immunodeficiency virus [HIV] disease: Secondary | ICD-10-CM

## 2012-07-12 LAB — CBC WITH DIFFERENTIAL/PLATELET
Basophils Absolute: 0 10*3/uL (ref 0.0–0.1)
Lymphocytes Relative: 45 % (ref 12–46)
Lymphs Abs: 3.7 10*3/uL (ref 0.7–4.0)
MCV: 78.3 fL (ref 78.0–100.0)
Neutro Abs: 3.8 10*3/uL (ref 1.7–7.7)
Neutrophils Relative %: 45 % (ref 43–77)
Platelets: 130 10*3/uL — ABNORMAL LOW (ref 150–400)
RBC: 5.25 MIL/uL (ref 4.22–5.81)
RDW: 15.4 % (ref 11.5–15.5)
WBC: 8.3 10*3/uL (ref 4.0–10.5)

## 2012-07-12 LAB — COMPLETE METABOLIC PANEL WITH GFR
AST: 24 U/L (ref 0–37)
Albumin: 4.2 g/dL (ref 3.5–5.2)
BUN: 11 mg/dL (ref 6–23)
Calcium: 9.9 mg/dL (ref 8.4–10.5)
Chloride: 99 mEq/L (ref 96–112)
Creat: 0.91 mg/dL (ref 0.50–1.35)
GFR, Est African American: >89 mL/min
GFR, Est Non African American: >89 mL/min
Glucose, Bld: 75 mg/dL (ref 70–99)

## 2012-07-13 ENCOUNTER — Ambulatory Visit: Payer: Self-pay | Admitting: Infectious Disease

## 2012-07-13 LAB — T-HELPER CELL (CD4) - (RCID CLINIC ONLY): CD4 T Cell Abs: 900 uL (ref 400–2700)

## 2012-07-14 LAB — HIV-1 RNA QUANT-NO REFLEX-BLD: HIV 1 RNA Quant: 565 copies/mL — ABNORMAL HIGH (ref <20)

## 2012-07-27 ENCOUNTER — Ambulatory Visit: Payer: Self-pay | Admitting: Infectious Disease

## 2012-07-28 ENCOUNTER — Ambulatory Visit (INDEPENDENT_AMBULATORY_CARE_PROVIDER_SITE_OTHER): Payer: Self-pay | Admitting: Infectious Disease

## 2012-07-28 ENCOUNTER — Encounter: Payer: Self-pay | Admitting: Infectious Disease

## 2012-07-28 VITALS — BP 166/112 | HR 80 | Temp 98.1°F | Ht 71.0 in | Wt 193.0 lb

## 2012-07-28 DIAGNOSIS — B20 Human immunodeficiency virus [HIV] disease: Secondary | ICD-10-CM

## 2012-07-28 DIAGNOSIS — L0292 Furuncle, unspecified: Secondary | ICD-10-CM

## 2012-07-28 DIAGNOSIS — M25469 Effusion, unspecified knee: Secondary | ICD-10-CM

## 2012-07-28 DIAGNOSIS — I10 Essential (primary) hypertension: Secondary | ICD-10-CM

## 2012-07-28 DIAGNOSIS — L0293 Carbuncle, unspecified: Secondary | ICD-10-CM

## 2012-07-28 DIAGNOSIS — Z23 Encounter for immunization: Secondary | ICD-10-CM

## 2012-07-28 DIAGNOSIS — M25462 Effusion, left knee: Secondary | ICD-10-CM

## 2012-07-28 DIAGNOSIS — A4902 Methicillin resistant Staphylococcus aureus infection, unspecified site: Secondary | ICD-10-CM

## 2012-07-28 DIAGNOSIS — F329 Major depressive disorder, single episode, unspecified: Secondary | ICD-10-CM

## 2012-07-28 MED ORDER — MUPIROCIN 2 % EX OINT: TOPICAL_OINTMENT | CUTANEOUS | Status: DC

## 2012-07-28 MED ORDER — HYDROCHLOROTHIAZIDE 25 MG PO TABS: 25.0000 mg | ORAL_TABLET | Freq: Every day | ORAL | Status: DC

## 2012-07-28 MED ORDER — EMTRICITAB-RILPIVIR-TENOFOV DF 200-25-300 MG PO TABS: 1.0000 | ORAL_TABLET | Freq: Every day | ORAL | Status: DC

## 2012-07-28 MED ORDER — CHLORHEXIDINE GLUCONATE 4 % EX LIQD: 60.0000 mL | CUTANEOUS | Status: DC

## 2012-07-28 NOTE — Progress Notes (Signed)
Subjective:    Patient ID: Clayton Erickson, male    DOB: 03/30/1980, 32 y.o.   MRN: [id number]  HPI   32 year old long term nonprogressor with viral loads in hundreds to few k range and cd4 above 1k. I had offered him enrollment in START, ACTG 5303 vs starting therapy and he had preferred  to simply continue to defer therapy in the past.   When I last saw him I again offered to start him on antiretrovirals and he had agreed to start complier but he never filled the medication.  Today he feels comfortable starting antiretroviral medication would also like him medications for his blood pressure  He also is concerned about recurrent MRSA infection when override dimensions including Hibiclens and mupirocin decolonization regimen for him to mass also give to his partner if this continues to recur.  Final he has developed a left-sided knee pain along with possible effusion. Now clear trauma or reason for this is easily found on review of his history. He has been without fevers nausea or chills.   Review of Systems  Constitutional: Negative for fever, chills, diaphoresis, activity change, appetite change, fatigue and unexpected weight change.  HENT: Negative for congestion, sore throat, rhinorrhea, sneezing, trouble swallowing and sinus pressure.   Eyes: Negative for photophobia and visual disturbance.  Respiratory: Negative for cough, chest tightness, shortness of breath, wheezing and stridor.   Cardiovascular: Negative for chest pain, palpitations and leg swelling.  Gastrointestinal: Negative for nausea, vomiting, abdominal pain, diarrhea, constipation, blood in stool, abdominal distention and anal bleeding.  Genitourinary: Negative for dysuria, hematuria, flank pain and difficulty urinating.  Musculoskeletal: Positive for joint swelling and arthralgias. Negative for myalgias, back pain and gait problem.  Skin: Negative for color change, pallor, rash and wound.  Neurological: Negative for  dizziness, tremors, weakness and light-headedness.  Hematological: Negative for adenopathy. Does not bruise/bleed easily.  Psychiatric/Behavioral: Negative for behavioral problems, confusion, sleep disturbance, dysphoric mood, decreased concentration and agitation.       Objective:   Physical Exam  Constitutional: He is oriented to person, place, and time. He appears well-developed and well-nourished. No distress.  HENT:  Head: Normocephalic and atraumatic.  Mouth/Throat: Oropharynx is clear and moist. No oropharyngeal exudate.  Eyes: Conjunctivae and EOM are normal. Pupils are equal, round, and reactive to light. No scleral icterus.  Neck: Normal range of motion. Neck supple. No JVD present.  Cardiovascular: Normal rate, regular rhythm and normal heart sounds.  Exam reveals no gallop and no friction rub.   No murmur heard. Pulmonary/Chest: Effort normal and breath sounds normal. No respiratory distress. He has no wheezes. He has no rales. He exhibits no tenderness.  Abdominal: He exhibits no distension and no mass. There is no tenderness. There is no rebound and no guarding.  Musculoskeletal: He exhibits no edema and no tenderness.       Left knee: He exhibits swelling and effusion.  Lymphadenopathy:    He has no cervical adenopathy.  Neurological: He is alert and oriented to person, place, and time. He has normal reflexes. He exhibits normal muscle tone. Coordination normal.  Skin: Skin is warm and dry. He is not diaphoretic. No erythema. No pallor.  Psychiatric: His behavior is normal. Judgment and thought content normal. His mood appears anxious. He exhibits a depressed mood.          Assessment & Plan:   Boils: Undoubtedly due to MRSA. We'll give him a Hibiclens bath regimen for 7 days of intranasal  mupirocin  HIV: will enroll into ADAP and start on complera    HTN: poorly controlled and am starting hydrochlorothiazide  Depression and anxiety/; lexapro and klonopin have  been  written for pt   Knee pain with effusion: Plain films and will ultimately refer to sports medicine

## 2012-07-28 NOTE — Progress Notes (Signed)
HPI: Clayton Erickson is a 32 y.o. male with HIV here for consideration of treatment. He was diagnosed 12 years prior but is treatment naive and appears to be a non-progressor. He does have a boyfriend who is also positive and is currently on treatment. Patient has been resistant to starting treatment in the past and was prescribed Complera previously but never filled it.  Of note his BP is also elevated today and has been previously as well. He had been prescribed HCTZ before but never filled this either.  Allergies: No Known Allergies  Vitals: Temp: 98.1 F (36.7 C) (05/29 1105) Temp src: Oral (05/29 1105) BP: 166/112 mmHg (05/29 1105) Pulse Rate: 80 (05/29 1105)  Past Medical History: No past medical history on file.  Social History: History   Social History  . Marital Status: Single    Spouse Name: N/A    Number of Children: N/A  . Years of Education: N/A   Social History Main Topics  . Smoking status: Former Smoker    Types: Cigarettes  . Smokeless tobacco: None  . Alcohol Use: None  . Drug Use: 30.00 per week    Special: Marijuana  . Sexually Active: Yes -- Male partner(s)     Comment: declined condoms   Other Topics Concern  . None   Social History Narrative  . None    Previous Regimen: Treatment naive  Labs: HIV 1 RNA Quant (copies/mL)  Date Value  07/12/2012 565*  02/16/2012 837*  05/27/2011 3224*     CD4 T Cell Abs (cmm)  Date Value  07/12/2012 900   02/16/2012 910   05/28/2011 1020      Hep B S Ab (no units)  Date Value  04/26/2006 NO      Hepatitis B Surface Ag (no units)  Date Value  04/26/2006 NO      HCV Ab (no units)  Date Value  04/26/2006 NO     CrCl: Estimated Creatinine Clearance: 124.1 ml/min (by C-G formula based on Cr of 0.91).  Lipids:    Component Value Date/Time   CHOL 181 02/16/2012 1510   TRIG 54 02/16/2012 1510   HDL 86 02/16/2012 1510   CHOLHDL 2.1 02/16/2012 1510   VLDL 11 02/16/2012 1510   LDLCALC 84  02/16/2012 1510    Assessment: Patient was very anxious about starting treatment as he was told previously it was not necessary. We spoke about the newer data showing benefit regardless of CD-4 count. We also spoke about the inflammatory side of HIV and how treatment could potentially lower his secondary risks from HIV like CVD. He ended up deciding to start treatment with Complera. We spoke about administration, adverse effects, and importance of adherence.  For his blood pressure he said he would be willing to start taking HCTZ.   Recommendations: - Start Complera one tab daily with a meal - Start HCTZ 25mg  one tab daily - If BP is still elevated at next visit would add amplodipine  Drue Stager, PharmD [facility] 07/28/2012, 1:48 PM

## 2012-07-29 ENCOUNTER — Other Ambulatory Visit: Payer: Self-pay | Admitting: *Deleted

## 2012-07-29 DIAGNOSIS — F411 Generalized anxiety disorder: Secondary | ICD-10-CM

## 2012-07-29 MED ORDER — CLONAZEPAM 0.5 MG PO TABS: 0.5000 mg | ORAL_TABLET | Freq: Every day | ORAL | Status: DC | PRN

## 2012-07-29 MED ORDER — ESCITALOPRAM OXALATE 20 MG PO TABS: 20.0000 mg | ORAL_TABLET | Freq: Every day | ORAL | Status: DC

## 2012-07-29 NOTE — Addendum Note (Signed)
Addended by: Andree Coss on: 07/29/2012 08:57 AM   Modules accepted: Orders

## 2012-08-25 ENCOUNTER — Other Ambulatory Visit: Payer: Self-pay

## 2012-08-25 DIAGNOSIS — B20 Human immunodeficiency virus [HIV] disease: Secondary | ICD-10-CM

## 2012-08-25 LAB — COMPLETE METABOLIC PANEL WITH GFR
ALT: 13 U/L (ref 0–53)
Alkaline Phosphatase: 57 U/L (ref 39–117)
CO2: 26 mEq/L (ref 19–32)
Creat: 1.06 mg/dL (ref 0.50–1.35)
GFR, Est African American: >89 mL/min
Sodium: 132 mEq/L — ABNORMAL LOW (ref 135–145)
Total Bilirubin: 0.4 mg/dL (ref 0.3–1.2)
Total Protein: 7.9 g/dL (ref 6.0–8.3)

## 2012-08-25 LAB — CBC WITH DIFFERENTIAL/PLATELET
HCT: 40.9 % (ref 39.0–52.0)
Hemoglobin: 14.4 g/dL (ref 13.0–17.0)
Lymphs Abs: 3.7 10*3/uL (ref 0.7–4.0)
MCH: 27 pg (ref 26.0–34.0)
Monocytes Absolute: 0.8 10*3/uL (ref 0.1–1.0)
Monocytes Relative: 9 % (ref 3–12)
Neutro Abs: 4.2 10*3/uL (ref 1.7–7.7)
Neutrophils Relative %: 47 % (ref 43–77)
RBC: 5.33 MIL/uL (ref 4.22–5.81)

## 2012-08-26 LAB — HIV-1 RNA QUANT-NO REFLEX-BLD
HIV 1 RNA Quant: <20 copies/mL (ref <20)
HIV-1 RNA Quant, Log: <1.3 {Log} (ref <1.30)

## 2012-08-26 LAB — T-HELPER CELL (CD4) - (RCID CLINIC ONLY)
CD4 % Helper T Cell: 25 % — ABNORMAL LOW (ref 33–55)
CD4 T Cell Abs: 970 uL (ref 400–2700)

## 2012-09-08 ENCOUNTER — Ambulatory Visit: Payer: Self-pay | Admitting: Infectious Disease

## 2012-09-14 ENCOUNTER — Ambulatory Visit: Payer: Self-pay | Admitting: Infectious Disease

## 2012-09-23 ENCOUNTER — Other Ambulatory Visit: Payer: Self-pay | Admitting: *Deleted

## 2012-09-23 ENCOUNTER — Telehealth: Payer: Self-pay | Admitting: *Deleted

## 2012-09-23 NOTE — Telephone Encounter (Signed)
Referral made to Sports Medicine for his left knee.  Appointment is 10/04/12 at 4:00 with Dr. Neomia Dear.  Left message informing patient of this appointment, as well as that they are asking him to meet with their financial counselor to discuss payment options.  Pt is encouraged to bring a $20 copay to his visit, but it is ok if he cannot. Andree Coss, RN

## 2012-10-04 ENCOUNTER — Ambulatory Visit: Payer: Self-pay | Admitting: Family Medicine

## 2012-10-14 ENCOUNTER — Ambulatory Visit: Payer: Self-pay | Admitting: Family Medicine

## 2013-01-05 ENCOUNTER — Other Ambulatory Visit: Payer: Self-pay

## 2013-01-12 ENCOUNTER — Telehealth: Payer: Self-pay | Admitting: *Deleted

## 2013-01-12 NOTE — Telephone Encounter (Signed)
Called Clayton Erickson.  No answer.  Left a voice mail.

## 2013-01-13 ENCOUNTER — Other Ambulatory Visit: Payer: Self-pay | Admitting: Licensed Clinical Social Worker

## 2013-01-13 DIAGNOSIS — B20 Human immunodeficiency virus [HIV] disease: Secondary | ICD-10-CM

## 2013-01-13 MED ORDER — EMTRICITAB-RILPIVIR-TENOFOV DF 200-25-300 MG PO TABS: 1.0000 | ORAL_TABLET | Freq: Every day | ORAL | Status: DC

## 2013-01-17 ENCOUNTER — Telehealth: Payer: Self-pay | Admitting: *Deleted

## 2013-01-17 NOTE — Telephone Encounter (Signed)
Called and left a voice mail for [place].  I asked if he can get in touch with me so we can set him up an appointment to apply for an emergency supply of his medication.

## 2013-02-03 ENCOUNTER — Other Ambulatory Visit: Payer: Self-pay | Admitting: *Deleted

## 2013-02-03 DIAGNOSIS — B20 Human immunodeficiency virus [HIV] disease: Secondary | ICD-10-CM

## 2013-02-03 MED ORDER — EMTRICITAB-RILPIVIR-TENOFOV DF 200-25-300 MG PO TABS: 1.0000 | ORAL_TABLET | Freq: Every day | ORAL | Status: DC

## 2013-02-06 ENCOUNTER — Other Ambulatory Visit: Payer: Self-pay | Admitting: *Deleted

## 2013-02-06 DIAGNOSIS — B20 Human immunodeficiency virus [HIV] disease: Secondary | ICD-10-CM

## 2013-02-06 MED ORDER — EMTRICITAB-RILPIVIR-TENOFOV DF 200-25-300 MG PO TABS: 1.0000 | ORAL_TABLET | Freq: Every day | ORAL | Status: DC

## 2013-04-04 ENCOUNTER — Other Ambulatory Visit (INDEPENDENT_AMBULATORY_CARE_PROVIDER_SITE_OTHER): Payer: Self-pay

## 2013-04-04 DIAGNOSIS — Z79899 Other long term (current) drug therapy: Secondary | ICD-10-CM

## 2013-04-04 DIAGNOSIS — Z113 Encounter for screening for infections with a predominantly sexual mode of transmission: Secondary | ICD-10-CM

## 2013-04-04 DIAGNOSIS — B20 Human immunodeficiency virus [HIV] disease: Secondary | ICD-10-CM

## 2013-04-04 LAB — COMPLETE METABOLIC PANEL WITH GFR
ALK PHOS: 62 U/L (ref 39–117)
ALT: 20 U/L (ref 0–53)
AST: 26 U/L (ref 0–37)
Albumin: 4.5 g/dL (ref 3.5–5.2)
BILIRUBIN TOTAL: 0.9 mg/dL (ref 0.2–1.2)
BUN: 10 mg/dL (ref 6–23)
CO2: 30 meq/L (ref 19–32)
CREATININE: 1.01 mg/dL (ref 0.50–1.35)
Calcium: 9.6 mg/dL (ref 8.4–10.5)
Chloride: 94 mEq/L — ABNORMAL LOW (ref 96–112)
Glucose, Bld: 92 mg/dL (ref 70–99)
Potassium: 3.9 mEq/L (ref 3.5–5.3)
SODIUM: 133 meq/L — AB (ref 135–145)
TOTAL PROTEIN: 8.3 g/dL (ref 6.0–8.3)

## 2013-04-04 LAB — LIPID PANEL
Cholesterol: 164 mg/dL (ref 0–200)
HDL: 66 mg/dL (ref >39)
LDL Cholesterol: 87 mg/dL (ref 0–99)
Total CHOL/HDL Ratio: 2.5 Ratio
Triglycerides: 57 mg/dL (ref <150)
VLDL: 11 mg/dL (ref 0–40)

## 2013-04-04 LAB — CBC WITH DIFFERENTIAL/PLATELET
Basophils Absolute: 0 10*3/uL (ref 0.0–0.1)
Basophils Relative: 0 % (ref 0–1)
EOS ABS: 0.1 10*3/uL (ref 0.0–0.7)
Eosinophils Relative: 1 % (ref 0–5)
HCT: 44.3 % (ref 39.0–52.0)
HEMOGLOBIN: 15.7 g/dL (ref 13.0–17.0)
Lymphocytes Relative: 41 % (ref 12–46)
Lymphs Abs: 4 10*3/uL (ref 0.7–4.0)
MCH: 27.9 pg (ref 26.0–34.0)
MCHC: 35.4 g/dL (ref 30.0–36.0)
MCV: 78.7 fL (ref 78.0–100.0)
MONOS PCT: 6 % (ref 3–12)
Monocytes Absolute: 0.6 10*3/uL (ref 0.1–1.0)
Neutro Abs: 5.1 10*3/uL (ref 1.7–7.7)
Neutrophils Relative %: 52 % (ref 43–77)
Platelets: 146 10*3/uL — ABNORMAL LOW (ref 150–400)
RBC: 5.63 MIL/uL (ref 4.22–5.81)
RDW: 15.2 % (ref 11.5–15.5)
WBC: 9.7 10*3/uL (ref 4.0–10.5)

## 2013-04-05 LAB — RPR

## 2013-04-05 LAB — T-HELPER CELL (CD4) - (RCID CLINIC ONLY)
CD4 % Helper T Cell: 27 % — ABNORMAL LOW (ref 33–55)
CD4 T Cell Abs: 1130 /uL (ref 400–2700)

## 2013-04-06 LAB — HIV-1 RNA QUANT-NO REFLEX-BLD: HIV-1 RNA Quant, Log: <1.3 {Log} (ref <1.30)

## 2013-04-19 ENCOUNTER — Other Ambulatory Visit: Payer: Self-pay | Admitting: *Deleted

## 2013-04-19 ENCOUNTER — Encounter: Payer: Self-pay | Admitting: Infectious Disease

## 2013-04-19 ENCOUNTER — Ambulatory Visit (INDEPENDENT_AMBULATORY_CARE_PROVIDER_SITE_OTHER): Payer: Self-pay | Admitting: Infectious Disease

## 2013-04-19 VITALS — BP 154/99 | HR 79 | Temp 97.9°F | Wt 189.0 lb

## 2013-04-19 DIAGNOSIS — F329 Major depressive disorder, single episode, unspecified: Secondary | ICD-10-CM

## 2013-04-19 DIAGNOSIS — F32A Depression, unspecified: Secondary | ICD-10-CM

## 2013-04-19 DIAGNOSIS — I10 Essential (primary) hypertension: Secondary | ICD-10-CM

## 2013-04-19 DIAGNOSIS — B20 Human immunodeficiency virus [HIV] disease: Secondary | ICD-10-CM

## 2013-04-19 DIAGNOSIS — F3289 Other specified depressive episodes: Secondary | ICD-10-CM

## 2013-04-19 DIAGNOSIS — F411 Generalized anxiety disorder: Secondary | ICD-10-CM

## 2013-04-19 MED ORDER — ESCITALOPRAM OXALATE 20 MG PO TABS: 20.0000 mg | ORAL_TABLET | Freq: Every day | ORAL | Status: DC

## 2013-04-19 NOTE — Patient Instructions (Signed)
MAKE A FOLLOWUP APPT FOR BP MANAGEMENT WITH DR. VAN DAM IN ONE MONTH  BLOOD WORK IN 4 MONTHS  ANOTHER APPT WITH DR VAN DAM IN 5 MONTHS  MEET WITH TISH TODAY TO RENEW ADAP

## 2013-04-19 NOTE — Progress Notes (Signed)
  Subjective:    Patient ID: Clayton Erickson, male    DOB: 06/27/1980, 33 y.o.   MRN: [id number]  HPI   33 year old long term nonprogressor with viral loads in hundreds to few k range and cd4 above 1k. I had offered him enrollment in START, ACTG 5303 vs starting therapy and he had preferred  to simply continue to defer therapy in the past but later started on complera.  .   Review of Systems  Constitutional: Negative for fever, chills, diaphoresis, activity change, appetite change, fatigue and unexpected weight change.  HENT: Negative for congestion, rhinorrhea, sinus pressure, sneezing, sore throat and trouble swallowing.   Eyes: Negative for photophobia and visual disturbance.  Respiratory: Negative for cough, chest tightness, shortness of breath, wheezing and stridor.   Cardiovascular: Negative for chest pain, palpitations and leg swelling.  Gastrointestinal: Negative for nausea, vomiting, abdominal pain, diarrhea, constipation, blood in stool, abdominal distention and anal bleeding.  Genitourinary: Negative for dysuria, hematuria, flank pain and difficulty urinating.  Musculoskeletal: Negative for arthralgias, back pain, gait problem, joint swelling and myalgias.  Skin: Negative for color change, pallor, rash and wound.  Neurological: Negative for dizziness, tremors, weakness and light-headedness.  Hematological: Negative for adenopathy. Does not bruise/bleed easily.  Psychiatric/Behavioral: Negative for behavioral problems, confusion, sleep disturbance, dysphoric mood, decreased concentration and agitation.       Objective:   Physical Exam  Constitutional: He is oriented to person, place, and time. He appears well-developed and well-nourished. No distress.  HENT:  Head: Normocephalic and atraumatic.  Mouth/Throat: Oropharynx is clear and moist. No oropharyngeal exudate.  Eyes: Conjunctivae and EOM are normal. Pupils are equal, round, and reactive to light. No scleral icterus.   Neck: Normal range of motion. Neck supple. No JVD present.  Cardiovascular: Normal rate, regular rhythm and normal heart sounds.  Exam reveals no gallop and no friction rub.   No murmur heard. Pulmonary/Chest: Effort normal and breath sounds normal. No respiratory distress. He has no wheezes. He has no rales. He exhibits no tenderness.  Abdominal: He exhibits no distension and no mass. There is no tenderness. There is no rebound and no guarding.  Musculoskeletal: He exhibits no edema and no tenderness.  Lymphadenopathy:    He has no cervical adenopathy.  Neurological: He is alert and oriented to person, place, and time. He exhibits normal muscle tone. Coordination normal.  Skin: Skin is warm and dry. He is not diaphoretic. No erythema. No pallor.  Psychiatric: His behavior is normal. Judgment and thought content normal.          Assessment & Plan:   HIV: continue complera. Renew ADAP.   HTN: poorly controlled, restarting  Hydrochlorothiazide and will re-assess at next visit.  Depression and anxiety/; lexapro and klonopin have been  written for pt

## 2013-04-21 ENCOUNTER — Ambulatory Visit: Payer: Self-pay

## 2013-04-24 ENCOUNTER — Other Ambulatory Visit: Payer: Self-pay | Admitting: *Deleted

## 2013-04-24 DIAGNOSIS — B20 Human immunodeficiency virus [HIV] disease: Secondary | ICD-10-CM

## 2013-04-24 MED ORDER — EMTRICITAB-RILPIVIR-TENOFOV DF 200-25-300 MG PO TABS: 1.0000 | ORAL_TABLET | Freq: Every day | ORAL | Status: DC

## 2013-04-24 NOTE — Telephone Encounter (Signed)
Previously refilled on 04/19/13.  Verbal order given.

## 2013-05-11 ENCOUNTER — Ambulatory Visit: Payer: Self-pay

## 2013-05-18 ENCOUNTER — Ambulatory Visit: Payer: Self-pay

## 2013-05-23 ENCOUNTER — Encounter: Payer: Self-pay | Admitting: *Deleted

## 2013-07-06 ENCOUNTER — Other Ambulatory Visit: Payer: Self-pay | Admitting: *Deleted

## 2013-07-06 DIAGNOSIS — B20 Human immunodeficiency virus [HIV] disease: Secondary | ICD-10-CM

## 2013-07-06 MED ORDER — EMTRICITAB-RILPIVIR-TENOFOV DF 200-25-300 MG PO TABS: 1.0000 | ORAL_TABLET | Freq: Every day | ORAL | Status: DC

## 2013-08-10 ENCOUNTER — Other Ambulatory Visit: Payer: Self-pay | Admitting: *Deleted

## 2013-08-10 DIAGNOSIS — I10 Essential (primary) hypertension: Secondary | ICD-10-CM

## 2013-08-10 MED ORDER — HYDROCHLOROTHIAZIDE 25 MG PO TABS: 25.0000 mg | ORAL_TABLET | Freq: Every day | ORAL | Status: DC

## 2013-08-17 ENCOUNTER — Other Ambulatory Visit (INDEPENDENT_AMBULATORY_CARE_PROVIDER_SITE_OTHER): Payer: Self-pay

## 2013-08-17 DIAGNOSIS — B20 Human immunodeficiency virus [HIV] disease: Secondary | ICD-10-CM

## 2013-08-17 DIAGNOSIS — Z113 Encounter for screening for infections with a predominantly sexual mode of transmission: Secondary | ICD-10-CM

## 2013-08-17 LAB — CBC WITH DIFFERENTIAL/PLATELET
BASOS ABS: 0 10*3/uL (ref 0.0–0.1)
Basophils Relative: 0 % (ref 0–1)
Eosinophils Absolute: 0.2 10*3/uL (ref 0.0–0.7)
Eosinophils Relative: 2 % (ref 0–5)
HCT: 42.7 % (ref 39.0–52.0)
HEMOGLOBIN: 15.5 g/dL (ref 13.0–17.0)
LYMPHS ABS: 2.7 10*3/uL (ref 0.7–4.0)
LYMPHS PCT: 30 % (ref 12–46)
MCH: 29.1 pg (ref 26.0–34.0)
MCHC: 36.3 g/dL — ABNORMAL HIGH (ref 30.0–36.0)
MCV: 80.3 fL (ref 78.0–100.0)
MONO ABS: 0.5 10*3/uL (ref 0.1–1.0)
MONOS PCT: 6 % (ref 3–12)
Neutro Abs: 5.6 10*3/uL (ref 1.7–7.7)
Neutrophils Relative %: 62 % (ref 43–77)
Platelets: 180 10*3/uL (ref 150–400)
RBC: 5.32 MIL/uL (ref 4.22–5.81)
RDW: 16.1 % — AB (ref 11.5–15.5)
WBC: 9.1 10*3/uL (ref 4.0–10.5)

## 2013-08-18 LAB — COMPLETE METABOLIC PANEL WITH GFR
ALBUMIN: 4.4 g/dL (ref 3.5–5.2)
ALT: 17 U/L (ref 0–53)
AST: 19 U/L (ref 0–37)
Alkaline Phosphatase: 64 U/L (ref 39–117)
BUN: 12 mg/dL (ref 6–23)
CALCIUM: 9.6 mg/dL (ref 8.4–10.5)
CHLORIDE: 99 meq/L (ref 96–112)
CO2: 25 meq/L (ref 19–32)
Creat: 1.21 mg/dL (ref 0.50–1.35)
GFR, Est Non African American: 78 mL/min
Glucose, Bld: 78 mg/dL (ref 70–99)
POTASSIUM: 4.3 meq/L (ref 3.5–5.3)
Sodium: 135 mEq/L (ref 135–145)
Total Bilirubin: 0.8 mg/dL (ref 0.2–1.2)
Total Protein: 8 g/dL (ref 6.0–8.3)

## 2013-08-18 LAB — T-HELPER CELL (CD4) - (RCID CLINIC ONLY)
CD4 % Helper T Cell: 35 % (ref 33–55)
CD4 T CELL ABS: 990 /uL (ref 400–2700)

## 2013-08-18 LAB — RPR

## 2013-08-19 LAB — HIV-1 RNA QUANT-NO REFLEX-BLD
HIV 1 RNA Quant: <20 copies/mL (ref <20)
HIV-1 RNA Quant, Log: <1.3 {Log} (ref <1.30)

## 2013-09-11 ENCOUNTER — Ambulatory Visit: Payer: Self-pay

## 2013-09-11 ENCOUNTER — Ambulatory Visit (INDEPENDENT_AMBULATORY_CARE_PROVIDER_SITE_OTHER): Payer: Self-pay | Admitting: Internal Medicine

## 2013-09-11 ENCOUNTER — Encounter: Payer: Self-pay | Admitting: Internal Medicine

## 2013-09-11 VITALS — BP 150/87 | HR 99 | Temp 98.2°F | Wt 181.2 lb

## 2013-09-11 DIAGNOSIS — B20 Human immunodeficiency virus [HIV] disease: Secondary | ICD-10-CM

## 2013-09-11 NOTE — Progress Notes (Signed)
Patient ID: Clayton Erickson, male   DOB: 02/01/1981, 33 y.o.   MRN: [id number]          Patient Active Problem List   Diagnosis Date Noted  . MRSA infection 03/15/2012  . Boil 03/15/2012  . Folliculitis 06/17/2011  . Ear build-up 06/17/2011  . ESSENTIAL HYPERTENSION, BENIGN 08/10/2008  . DIARRHEA 02/17/2008  . ANXIETY STATE, UNSPECIFIED 11/18/2007  . 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 RASH AND OTHER NONSPECIFIC SKIN ERUPTION 04/29/2007  . HIV DISEASE 03/10/2006    Patient's Medications  New Prescriptions   No medications on file  Previous Medications   CLONAZEPAM (KLONOPIN) 0.5 MG TABLET    Take 1 tablet (0.5 mg total) by mouth daily as needed for anxiety.   EMTRICITAB-RILPIVIR-TENOFOVIR 200-25-300 MG TABS    Take 1 tablet by mouth daily. TAKE WITH 400 CALORIE MEAL   ESCITALOPRAM (LEXAPRO) 20 MG TABLET    Take 1 tablet (20 mg total) by mouth daily.   HYDROCHLOROTHIAZIDE (HYDRODIURIL) 25 MG TABLET    Take 1 tablet (25 mg total) by mouth daily.   NAPROXEN SODIUM (ANAPROX) 220 MG TABLET    Take 220 mg by mouth as needed (headache).  Modified Medications   No medications on file  Discontinued Medications   No medications on file    Subjective: Clayton Erickson is in for a routine followup visit. He missed his Complera last night because he had run out but otherwise does not recall missing any doses since his last visit. He takes it in the evening with a meal. He takes his hydrochlorothiazide in the morning and admits that only recently did he start taking it on a routine basis. He stopped taking Lexapro because he did not like the way it made him feel. He states that his family says that he is moody but he says he does not feel depressed. He is currently looking for work.  Review of Systems: Pertinent items are noted in HPI.  No past medical history on file.  History  Substance Use Topics  . Smoking status: Former Smoker    Types: Cigarettes  . Smokeless  tobacco: Not on file  . Alcohol Use: Not on file    No family history on file.  No Known Allergies  Objective: Temp: 98.2 F (36.8 C) (07/13 1541) BP: 150/87 mmHg (07/13 1541) Pulse Rate: 99 (07/13 1541) Body mass index is 25.29 kg/(m^2).  General: He is talkative and in good spirits Skin: He has a small follicular basis on his left lower back it is not infected  Lab Results Lab Results  Component Value Date   WBC 9.1 08/17/2013   HGB 15.5 08/17/2013   HCT 42.7 08/17/2013   MCV 80.3 08/17/2013   PLT 180 08/17/2013    Lab Results  Component Value Date   CREATININE 1.21 08/17/2013   BUN 12 08/17/2013   NA 135 08/17/2013   K 4.3 08/17/2013   CL 99 08/17/2013   CO2 25 08/17/2013    Lab Results  Component Value Date   ALT 17 08/17/2013   AST 19 08/17/2013   ALKPHOS 64 08/17/2013   BILITOT 0.8 08/17/2013    Lab Results  Component Value Date   CHOL 164 04/04/2013   HDL 66 04/04/2013   LDLCALC 87 04/04/2013   TRIG 57 04/04/2013   CHOLHDL 2.5 04/04/2013    Lab Results HIV 1 RNA Quant (copies/mL)  Date Value  08/17/2013 <20   04/04/2013 <20   08/25/2012 <  20      CD4 T Cell Abs (/uL)  Date Value  08/17/2013 990   04/04/2013 1130   08/25/2012 970      Assessment: His HIV infection remains under excellent control. He has recertified for ADAP and will be picking up a new refill of Complera today.  His blood pressure is not under optimal control yet. I will continue hydrochlorothiazide for now.  He does not appear to be depressed currently  Plan: 1. Continue Complera and hydrochlorothiazide 2. Followup after blood work in 6 months   Cliffton Asters, MD [facility] [phone] pager   [phone] cell 09/11/2013, 4:15 PM

## 2013-09-13 ENCOUNTER — Ambulatory Visit: Payer: Self-pay | Admitting: Infectious Disease

## 2013-09-14 ENCOUNTER — Ambulatory Visit: Payer: Self-pay | Admitting: Infectious Disease

## 2013-11-13 ENCOUNTER — Other Ambulatory Visit: Payer: Self-pay | Admitting: *Deleted

## 2013-11-13 DIAGNOSIS — B20 Human immunodeficiency virus [HIV] disease: Secondary | ICD-10-CM

## 2013-11-13 MED ORDER — EMTRICITAB-RILPIVIR-TENOFOV DF 200-25-300 MG PO TABS: 1.0000 | ORAL_TABLET | Freq: Every day | ORAL | Status: DC

## 2013-11-13 NOTE — Telephone Encounter (Signed)
ADAP Application 

## 2014-02-12 ENCOUNTER — Telehealth: Payer: Self-pay | Admitting: *Deleted

## 2014-02-12 NOTE — Telephone Encounter (Signed)
Patient called and advised he is having sever stomach pains when he eats, has a bowel movement or touches his abdomen. Patient states that he feels his food going through his intestines and it is painful. He advised he noticed some blood in his stool this weekend and wants to be seen as he thinks it is because of his medication. Advised the patient to be seen at the urgent care but he insist he needs to be seen here. Gave him an appt for tomorrow 02/13/14 with Dr Orvan Falconerampbell.

## 2014-02-13 ENCOUNTER — Ambulatory Visit: Payer: Self-pay | Admitting: Internal Medicine

## 2014-02-25 ENCOUNTER — Other Ambulatory Visit: Payer: Self-pay | Admitting: Infectious Disease

## 2014-02-25 DIAGNOSIS — B20 Human immunodeficiency virus [HIV] disease: Secondary | ICD-10-CM

## 2014-03-12 ENCOUNTER — Other Ambulatory Visit: Payer: Self-pay

## 2014-03-13 ENCOUNTER — Other Ambulatory Visit: Payer: Self-pay

## 2014-03-13 ENCOUNTER — Ambulatory Visit: Payer: Self-pay

## 2014-03-26 ENCOUNTER — Ambulatory Visit: Payer: Self-pay | Admitting: Infectious Disease

## 2014-04-07 ENCOUNTER — Other Ambulatory Visit: Payer: Self-pay | Admitting: Infectious Disease

## 2014-05-02 ENCOUNTER — Other Ambulatory Visit: Payer: Self-pay

## 2014-05-02 ENCOUNTER — Ambulatory Visit: Payer: Self-pay

## 2014-05-16 ENCOUNTER — Ambulatory Visit (INDEPENDENT_AMBULATORY_CARE_PROVIDER_SITE_OTHER): Payer: Self-pay | Admitting: Infectious Disease

## 2014-05-16 ENCOUNTER — Encounter: Payer: Self-pay | Admitting: Infectious Disease

## 2014-05-16 ENCOUNTER — Other Ambulatory Visit: Payer: Self-pay

## 2014-05-16 VITALS — BP 166/117 | HR 91 | Temp 97.6°F | Wt 187.0 lb

## 2014-05-16 DIAGNOSIS — I10 Essential (primary) hypertension: Secondary | ICD-10-CM

## 2014-05-16 DIAGNOSIS — L02214 Cutaneous abscess of groin: Secondary | ICD-10-CM

## 2014-05-16 DIAGNOSIS — B20 Human immunodeficiency virus [HIV] disease: Secondary | ICD-10-CM

## 2014-05-16 DIAGNOSIS — L0292 Furuncle, unspecified: Secondary | ICD-10-CM

## 2014-05-16 DIAGNOSIS — Z113 Encounter for screening for infections with a predominantly sexual mode of transmission: Secondary | ICD-10-CM

## 2014-05-16 DIAGNOSIS — A4902 Methicillin resistant Staphylococcus aureus infection, unspecified site: Secondary | ICD-10-CM

## 2014-05-16 LAB — COMPLETE METABOLIC PANEL WITH GFR
ALBUMIN: 4.6 g/dL (ref 3.5–5.2)
ALT: 14 U/L (ref 0–53)
AST: 21 U/L (ref 0–37)
Alkaline Phosphatase: 57 U/L (ref 39–117)
BUN: 12 mg/dL (ref 6–23)
CALCIUM: 9.4 mg/dL (ref 8.4–10.5)
CHLORIDE: 98 meq/L (ref 96–112)
CO2: 29 mEq/L (ref 19–32)
Creat: 0.93 mg/dL (ref 0.50–1.35)
GFR, Est African American: >89 mL/min
GFR, Est Non African American: >89 mL/min
Glucose, Bld: 67 mg/dL — ABNORMAL LOW (ref 70–99)
Potassium: 4.3 mEq/L (ref 3.5–5.3)
Sodium: 137 mEq/L (ref 135–145)
Total Bilirubin: 0.7 mg/dL (ref 0.2–1.2)
Total Protein: 7.9 g/dL (ref 6.0–8.3)

## 2014-05-16 LAB — RPR

## 2014-05-16 MED ORDER — DOXYCYCLINE HYCLATE 100 MG PO TABS: 100.0000 mg | ORAL_TABLET | Freq: Two times a day (BID) | ORAL | Status: DC

## 2014-05-16 MED ORDER — EMTRICITAB-RILPIVIR-TENOFOV DF 200-25-300 MG PO TABS: ORAL_TABLET | ORAL | Status: DC

## 2014-05-16 MED ORDER — TRAMADOL HCL 50 MG PO TABS: 50.0000 mg | ORAL_TABLET | Freq: Two times a day (BID) | ORAL | Status: DC

## 2014-05-16 NOTE — Patient Instructions (Signed)
We need blood work today  We need you to renew ADAP  I would like you to meet with Pam for getting meds sooner than ADAP renewal via [employer] [employer]  I will also send in script for Compera in meantime

## 2014-05-16 NOTE — Progress Notes (Signed)
Subjective:    Patient ID: Clayton Erickson, male    DOB: September 07, 1980, 34 y.o.   MRN: [id number]  HPI  34 year old who had been perfectly controlled on Complera but who has been out of care for in terms of being seen by an MD for more than a year. He did renew ADAP this fall but has not done so yet. His last rx of Complera was called in from clinic in December by our records but without refills though he says he has only been out for one month  He has several issues today  #1 He has stopped his HCTZ because "it makes me pee on myself."  #2 He has recurrence of boil in left groin that has become exquisitely tender.     Review of Systems  Constitutional: Negative for fever, chills, diaphoresis, activity change, appetite change, fatigue and unexpected weight change.  HENT: Negative for congestion, rhinorrhea, sinus pressure, sneezing, sore throat and trouble swallowing.   Eyes: Negative for photophobia and visual disturbance.  Respiratory: Negative for cough, chest tightness, shortness of breath, wheezing and stridor.   Cardiovascular: Negative for chest pain, palpitations and leg swelling.  Gastrointestinal: Negative for nausea, vomiting, abdominal pain, diarrhea, constipation, blood in stool, abdominal distention and anal bleeding.  Genitourinary: Negative for dysuria, hematuria, flank pain and difficulty urinating.  Musculoskeletal: Negative for myalgias, back pain, joint swelling, arthralgias and gait problem.  Skin: Positive for rash. Negative for color change and pallor.  Neurological: Negative for dizziness, tremors, weakness and light-headedness.  Hematological: Negative for adenopathy. Does not bruise/bleed easily.  Psychiatric/Behavioral: Negative for behavioral problems, confusion, sleep disturbance, dysphoric mood, decreased concentration and agitation.       Objective:   Physical Exam  Constitutional: He is oriented to person, place, and time. He appears well-developed and  well-nourished.  HENT:  Head: Normocephalic and atraumatic.  Eyes: Conjunctivae and EOM are normal.  Neck: Normal range of motion. Neck supple.  Cardiovascular: Normal rate and regular rhythm.   Pulmonary/Chest: Effort normal. No respiratory distress. He has no wheezes.  Abdominal: Soft. He exhibits no distension.  Genitourinary:     Musculoskeletal: Normal range of motion. He exhibits no edema or tenderness.  Neurological: He is alert and oriented to person, place, and time.  Skin: Skin is warm and dry. No rash noted. No erythema. No pallor.   Left inguinal boil that is raised fluctuant and exquisitely tender to palaption           Assessment & Plan:   HIV: renew ADAP, get labs today, call in Complera (his VL is likely not high given his hx of low viremia prior to therapy. I spent greater than 40 minutes with the patient including greater than 50% of time in face to face counsel of the patient and in coordination of their care. Extensive discussion re HIV and aRVS  HTN: change to amlodipine  Boil: likely due to MRSA  I performed I and D, sent for culture and called in doxy. Ultram given for pain after he badgered [place] about need for pain control and that ibuprofen does not work  He may try bleach baths in future  Description:  Permission: Informed consent was obtained from the patient with risk and benefits explained in laymans terms and paper copy including time out and signatures are in paper to be scanned to chart  Description: Inguinal area was cleaned with betadine swabs. Area was anesthestized with topical anesthetic. 1% lidocaine in amount of approximately  14 mil injected infiltrated under skin to achieve anesthesia  Approximately 3cm incision horizontally made with retunr of  Blood and frank pus which was sent for culture.  Area bandaged.  Complications: none. EBL minimal.

## 2014-05-17 ENCOUNTER — Other Ambulatory Visit: Payer: Self-pay | Admitting: *Deleted

## 2014-05-17 DIAGNOSIS — B20 Human immunodeficiency virus [HIV] disease: Secondary | ICD-10-CM

## 2014-05-17 LAB — CBC WITH DIFFERENTIAL/PLATELET
BASOS PCT: 0 % (ref 0–1)
Basophils Absolute: 0 10*3/uL (ref 0.0–0.1)
Eosinophils Absolute: 0 10*3/uL (ref 0.0–0.7)
Eosinophils Relative: 0 % (ref 0–5)
HCT: 43.7 % (ref 39.0–52.0)
Hemoglobin: 14.9 g/dL (ref 13.0–17.0)
Lymphocytes Relative: 31 % (ref 12–46)
Lymphs Abs: 3.5 10*3/uL (ref 0.7–4.0)
MCH: 27.6 pg (ref 26.0–34.0)
MCHC: 34.1 g/dL (ref 30.0–36.0)
MCV: 81.1 fL (ref 78.0–100.0)
MONO ABS: 0.9 10*3/uL (ref 0.1–1.0)
MONOS PCT: 8 % (ref 3–12)
MPV: 9.5 fL (ref 8.6–12.4)
Neutro Abs: 7 10*3/uL (ref 1.7–7.7)
Neutrophils Relative %: 61 % (ref 43–77)
RBC: 5.39 MIL/uL (ref 4.22–5.81)
RDW: 15.4 % (ref 11.5–15.5)
WBC: 11.4 10*3/uL — AB (ref 4.0–10.5)

## 2014-05-17 LAB — T-HELPER CELL (CD4) - (RCID CLINIC ONLY)
CD4 T CELL HELPER: 29 % — AB (ref 33–55)
CD4 T Cell Abs: 1000 /uL (ref 400–2700)

## 2014-05-17 LAB — URINE CYTOLOGY ANCILLARY ONLY
Chlamydia: NEGATIVE
Neisseria Gonorrhea: NEGATIVE

## 2014-05-17 MED ORDER — EMTRICITAB-RILPIVIR-TENOFOV DF 200-25-300 MG PO TABS: ORAL_TABLET | ORAL | Status: DC

## 2014-05-17 NOTE — Telephone Encounter (Signed)
ADAP Application 

## 2014-05-18 LAB — WOUND CULTURE
Gram Stain: NONE SEEN
Gram Stain: NONE SEEN
Organism ID, Bacteria: NO GROWTH

## 2014-05-18 LAB — HIV-1 RNA ULTRAQUANT REFLEX TO GENTYP+
HIV 1 RNA QUANT: 26035 {copies}/mL — AB (ref <20)
HIV-1 RNA Quant, Log: 4.42 {Log} — ABNORMAL HIGH (ref <1.30)

## 2014-05-25 ENCOUNTER — Encounter: Payer: Self-pay | Admitting: Infectious Disease

## 2014-05-25 LAB — HLA B*5701: HLA-B*5701 w/rflx HLA-B High: NEGATIVE

## 2014-05-29 LAB — HIV-1 GENOTYPR PLUS

## 2014-06-06 ENCOUNTER — Encounter: Payer: Self-pay | Admitting: Infectious Disease

## 2014-09-06 ENCOUNTER — Other Ambulatory Visit (INDEPENDENT_AMBULATORY_CARE_PROVIDER_SITE_OTHER): Payer: Self-pay

## 2014-09-06 DIAGNOSIS — Z79899 Other long term (current) drug therapy: Secondary | ICD-10-CM

## 2014-09-06 DIAGNOSIS — Z113 Encounter for screening for infections with a predominantly sexual mode of transmission: Secondary | ICD-10-CM

## 2014-09-06 DIAGNOSIS — B20 Human immunodeficiency virus [HIV] disease: Secondary | ICD-10-CM

## 2014-09-06 LAB — CBC
HCT: 41.4 % (ref 39.0–52.0)
HEMOGLOBIN: 14.4 g/dL (ref 13.0–17.0)
MCH: 28.1 pg (ref 26.0–34.0)
MCHC: 34.8 g/dL (ref 30.0–36.0)
MCV: 80.7 fL (ref 78.0–100.0)
MPV: 9.7 fL (ref 8.6–12.4)
Platelets: 125 10*3/uL — ABNORMAL LOW (ref 150–400)
RBC: 5.13 MIL/uL (ref 4.22–5.81)
RDW: 16.2 % — ABNORMAL HIGH (ref 11.5–15.5)
WBC: 10.4 10*3/uL (ref 4.0–10.5)

## 2014-09-06 LAB — COMPREHENSIVE METABOLIC PANEL
ALK PHOS: 55 U/L (ref 39–117)
ALT: 27 U/L (ref 0–53)
AST: 24 U/L (ref 0–37)
Albumin: 4.5 g/dL (ref 3.5–5.2)
BILIRUBIN TOTAL: 0.6 mg/dL (ref 0.2–1.2)
BUN: 16 mg/dL (ref 6–23)
CO2: 26 mEq/L (ref 19–32)
CREATININE: 0.93 mg/dL (ref 0.50–1.35)
Calcium: 9.6 mg/dL (ref 8.4–10.5)
Chloride: 97 mEq/L (ref 96–112)
GLUCOSE: 97 mg/dL (ref 70–99)
Potassium: 4.4 mEq/L (ref 3.5–5.3)
SODIUM: 133 meq/L — AB (ref 135–145)
Total Protein: 7.7 g/dL (ref 6.0–8.3)

## 2014-09-06 LAB — LIPID PANEL
CHOL/HDL RATIO: 2 ratio
Cholesterol: 175 mg/dL (ref 0–200)
HDL: 88 mg/dL (ref >=40)
LDL Cholesterol: 74 mg/dL (ref 0–99)
TRIGLYCERIDES: 66 mg/dL (ref <150)
VLDL: 13 mg/dL (ref 0–40)

## 2014-09-07 LAB — URINE CYTOLOGY ANCILLARY ONLY
Chlamydia: NEGATIVE
NEISSERIA GONORRHEA: NEGATIVE

## 2014-09-07 LAB — T-HELPER CELL (CD4) - (RCID CLINIC ONLY)
CD4 T CELL ABS: 1090 /uL (ref 400–2700)
CD4 T CELL HELPER: 35 % (ref 33–55)

## 2014-09-07 LAB — RPR

## 2014-09-08 LAB — HIV-1 RNA QUANT-NO REFLEX-BLD

## 2014-09-13 ENCOUNTER — Encounter: Payer: Self-pay | Admitting: *Deleted

## 2014-09-13 ENCOUNTER — Other Ambulatory Visit: Payer: Self-pay | Admitting: Infectious Disease

## 2014-09-13 ENCOUNTER — Encounter: Payer: Self-pay | Admitting: Internal Medicine

## 2014-09-20 ENCOUNTER — Ambulatory Visit: Payer: Self-pay | Admitting: Infectious Disease

## 2014-10-09 ENCOUNTER — Encounter: Payer: Self-pay | Admitting: *Deleted

## 2014-11-23 ENCOUNTER — Telehealth: Payer: Self-pay | Admitting: *Deleted

## 2014-11-23 NOTE — Telephone Encounter (Signed)
Patient called and left a detailed voice mail stating he has moved to [place] and can not get in to see an MD right away due to his job schedule. He wanted an Rx faxed to their case worker so he can get his medication. I called back and got his voice mail and said that would be fine as he was last seen in March of 2016; however, there is not an MD here today to sign so it will have to be Monday. Advised patient to call back with the fax #. Wendall Mola

## 2014-12-04 ENCOUNTER — Other Ambulatory Visit: Payer: Self-pay | Admitting: Infectious Disease

## 2014-12-04 ENCOUNTER — Telehealth: Payer: Self-pay | Admitting: Infectious Disease

## 2014-12-04 DIAGNOSIS — B20 Human immunodeficiency virus [HIV] disease: Secondary | ICD-10-CM

## 2014-12-04 MED ORDER — EMTRICITAB-RILPIVIR-TENOFOV DF 200-25-300 MG PO TABS: ORAL_TABLET | ORAL | Status: DC

## 2014-12-04 NOTE — Telephone Encounter (Signed)
Patient called wanting a refill faxed to the [facility] clinic pharmacy at [phone], patient is currently working and living in [place]. I will fax this refill and the patient is looking to establish care in [place].

## 2015-02-27 ENCOUNTER — Telehealth: Payer: Self-pay | Admitting: *Deleted

## 2015-02-27 NOTE — Telephone Encounter (Signed)
Call from [place], pharmacist in [place] requesting a refill for patient. We were notified in October that patient has relocated to [place] and would be establishing care there. A one time refill was done at that time. Refill denied

## 2017-11-17 ENCOUNTER — Other Ambulatory Visit: Payer: Self-pay

## 2017-11-17 ENCOUNTER — Ambulatory Visit: Payer: Self-pay

## 2017-11-17 NOTE — Addendum Note (Signed)
Addended by: Adi Doro F on: 11/17/2017 09:25 AM   Modules accepted: Orders  

## 2017-11-17 NOTE — Addendum Note (Signed)
Addended by: POOLE, TRAVIS F on: 11/17/2017 09:25 AM   Modules accepted: Orders  

## 2017-11-17 NOTE — Addendum Note (Signed)
Addended by: Lurlean LeydenPOOLE, Lakishia Bourassa F on: 11/17/2017 09:24 AM   Modules accepted: Orders

## 2017-11-17 NOTE — Addendum Note (Signed)
Addended by: Lurlean LeydenPOOLE, Avon Mergenthaler F on: 11/17/2017 09:25 AM   Modules accepted: Orders

## 2017-11-18 ENCOUNTER — Other Ambulatory Visit: Payer: Self-pay

## 2017-11-18 ENCOUNTER — Ambulatory Visit: Payer: Self-pay

## 2017-11-18 DIAGNOSIS — B20 Human immunodeficiency virus [HIV] disease: Secondary | ICD-10-CM

## 2017-11-19 LAB — T-HELPER CELL (CD4) - (RCID CLINIC ONLY)
CD4 % Helper T Cell: 23 % — ABNORMAL LOW (ref 33–55)
CD4 T CELL ABS: 790 /uL (ref 400–2700)

## 2017-11-22 LAB — CBC WITH DIFFERENTIAL/PLATELET
BASOS ABS: 25 {cells}/uL (ref 0–200)
Basophils Relative: 0.2 %
EOS ABS: 62 {cells}/uL (ref 15–500)
Eosinophils Relative: 0.5 %
HEMATOCRIT: 41.4 % (ref 38.5–50.0)
HEMOGLOBIN: 14.5 g/dL (ref 13.2–17.1)
Lymphs Abs: 3493 cells/uL (ref 850–3900)
MCH: 28.8 pg (ref 27.0–33.0)
MCHC: 35 g/dL (ref 32.0–36.0)
MCV: 82.3 fL (ref 80.0–100.0)
MPV: 10.1 fL (ref 7.5–12.5)
Monocytes Relative: 8.2 %
NEUTROS ABS: 7712 {cells}/uL (ref 1500–7800)
Neutrophils Relative %: 62.7 %
Platelets: 135 10*3/uL — ABNORMAL LOW (ref 140–400)
RBC: 5.03 10*6/uL (ref 4.20–5.80)
RDW: 12.8 % (ref 11.0–15.0)
Total Lymphocyte: 28.4 %
WBC mixed population: 1009 cells/uL — ABNORMAL HIGH (ref 200–950)
WBC: 12.3 10*3/uL — ABNORMAL HIGH (ref 3.8–10.8)

## 2017-11-22 LAB — COMPLETE METABOLIC PANEL WITH GFR
AG Ratio: 1.5 (calc) (ref 1.0–2.5)
ALBUMIN MSPROF: 4.6 g/dL (ref 3.6–5.1)
ALKALINE PHOSPHATASE (APISO): 55 U/L (ref 40–115)
ALT: 18 U/L (ref 9–46)
AST: 22 U/L (ref 10–40)
BILIRUBIN TOTAL: 0.8 mg/dL (ref 0.2–1.2)
BUN: 11 mg/dL (ref 7–25)
CO2: 26 mmol/L (ref 20–32)
CREATININE: 1.08 mg/dL (ref 0.60–1.35)
Calcium: 9.7 mg/dL (ref 8.6–10.3)
Chloride: 100 mmol/L (ref 98–110)
GFR, EST AFRICAN AMERICAN: 101 mL/min/{1.73_m2} (ref >=60)
GFR, Est Non African American: 87 mL/min/{1.73_m2} (ref >=60)
Globulin: 3 g/dL (calc) (ref 1.9–3.7)
Glucose, Bld: 66 mg/dL (ref 65–99)
Potassium: 4 mmol/L (ref 3.5–5.3)
SODIUM: 133 mmol/L — AB (ref 135–146)
TOTAL PROTEIN: 7.6 g/dL (ref 6.1–8.1)

## 2017-11-22 LAB — HIV-1 RNA QUANT-NO REFLEX-BLD
HIV 1 RNA Quant: 3340 copies/mL — ABNORMAL HIGH
HIV-1 RNA Quant, Log: 3.52 Log copies/mL — ABNORMAL HIGH

## 2017-11-22 LAB — RPR: RPR Ser Ql: NONREACTIVE

## 2017-11-23 ENCOUNTER — Telehealth: Payer: Self-pay | Admitting: Infectious Disease

## 2017-11-23 DIAGNOSIS — B20 Human immunodeficiency virus [HIV] disease: Secondary | ICD-10-CM

## 2017-11-23 NOTE — Telephone Encounter (Signed)
Relayed orders to lab.

## 2017-11-23 NOTE — Telephone Encounter (Signed)
Pt needs GENOTYPE added to VL that was done

## 2017-11-23 NOTE — Telephone Encounter (Signed)
Perfect

## 2017-12-02 ENCOUNTER — Encounter: Payer: Self-pay | Admitting: Family

## 2017-12-02 LAB — HIV-1 GENOTYPING (RTI,PI,IN INHBTR): HIV-1 GENOTYPE: DETECTED — AB

## 2017-12-07 ENCOUNTER — Encounter: Payer: Self-pay | Admitting: Internal Medicine

## 2017-12-07 ENCOUNTER — Ambulatory Visit: Payer: Self-pay

## 2017-12-09 NOTE — Progress Notes (Deleted)
Subjective:    Patient ID: Clayton Erickson, male    DOB: 10-21-1980, 38 y.o.   MRN: [id number]  No chief complaint on file.   HPI:  Clayton Erickson is a 37 y.o. male who presents today for follow up of his HIV disease after being out of care for over 3 years.   Clayton Erickson was last seen in the office on 05/16/2014 with his antiretroviral regimen of Complera for which he was undetectable, however in and out of care.  Most recent blood work completed on 11/18/2017 with a viral load of 3340 and CD4 count of 790.  Genotype performed with no resistance indicating wild-type virus.  STI testing for syphilis was negative.  Kidney function, liver function, electrolytes within the normal ranges.   No Known Allergies    Outpatient Medications Prior to Visit  Medication Sig Dispense Refill  . clonazePAM (KLONOPIN) 0.5 MG tablet Take 1 tablet (0.5 mg total) by mouth daily as needed for anxiety. 30 tablet 4  . doxycycline (VIBRA-TABS) 100 MG tablet Take 1 tablet (100 mg total) by mouth 2 (two) times daily. 28 tablet 1  . Emtricitab-Rilpivir-Tenofov DF (COMPLERA) 200-25-300 MG TABS TAKE 1 TABLET BY MOUTH DAILY WITH A 400 CALORIE MEAL 30 tablet 1  . hydrochlorothiazide (HYDRODIURIL) 25 MG tablet TAKE 1 TABLET BY MOUTH EVERY DAY 30 tablet 1  . naproxen sodium (ANAPROX) 220 MG tablet Take 220 mg by mouth as needed (headache).    . traMADol (ULTRAM) 50 MG tablet Take 1 tablet (50 mg total) by mouth 2 (two) times daily. 30 tablet 0   No facility-administered medications prior to visit.      Past Medical History:  Diagnosis Date  . Boils   . HIV infection   . Hypertension       Past Surgical History:  Procedure Laterality Date  . I&D EXTREMITY        No family history on file.    Social History   Socioeconomic History  . Marital status: Single    Spouse name: Not on file  . Number of children: Not on file  . Years of education: Not on file  . Highest education level: Not on file    Occupational History  . Not on file  Social Needs  . Financial resource strain: Not on file  . Food insecurity:    Worry: Not on file    Inability: Not on file  . Transportation needs:    Medical: Not on file    Non-medical: Not on file  Tobacco Use  . Smoking status: Former Smoker    Types: Cigarettes  Substance and Sexual Activity  . Alcohol use: Not on file  . Drug use: Yes    Frequency: 30.0 times per week    Types: Marijuana    Comment: stopped a month a a half ago  . Sexual activity: Yes    Partners: Male    Comment: declined condoms  Lifestyle  . Physical activity:    Days per week: Not on file    Minutes per session: Not on file  . Stress: Not on file  Relationships  . Social connections:    Talks on phone: Not on file    Gets together: Not on file    Attends religious service: Not on file    Active member of club or organization: Not on file    Attends meetings of clubs or organizations: Not on file    Relationship status: Not on  file  . Intimate partner violence:    Fear of current or ex partner: Not on file    Emotionally abused: Not on file    Physically abused: Not on file    Forced sexual activity: Not on file  Other Topics Concern  . Not on file  Social History Narrative  . Not on file      Review of Systems  Constitutional: Negative for appetite change, chills, fatigue, fever and unexpected weight change.  Eyes: Negative for visual disturbance.  Respiratory: Negative for cough, chest tightness, shortness of breath and wheezing.   Cardiovascular: Negative for chest pain and leg swelling.  Gastrointestinal: Negative for abdominal pain, constipation, diarrhea, nausea and vomiting.  Genitourinary: Negative for dysuria, flank pain, frequency, genital sores, hematuria and urgency.  Skin: Negative for rash.  Allergic/Immunologic: Negative for immunocompromised state.  Neurological: Negative for dizziness and headaches.       Objective:     There were no vitals taken for this visit. Nursing note and vital signs reviewed.  Physical Exam  Constitutional: He is oriented to person, place, and time. He appears well-developed. No distress.  HENT:  Mouth/Throat: Oropharynx is clear and moist.  Eyes: Conjunctivae are normal.  Neck: Neck supple.  Cardiovascular: Normal rate, regular rhythm, normal heart sounds and intact distal pulses. Exam reveals no gallop and no friction rub.  No murmur heard. Pulmonary/Chest: Effort normal and breath sounds normal. No respiratory distress. He has no wheezes. He has no rales. He exhibits no tenderness.  Abdominal: Soft. Bowel sounds are normal. There is no tenderness.  Lymphadenopathy:    He has no cervical adenopathy.  Neurological: He is alert and oriented to person, place, and time.  Skin: Skin is warm and dry. No rash noted.  Psychiatric: He has a normal mood and affect. His behavior is normal. Judgment and thought content normal.        Assessment & Plan:   Problem List Items Addressed This Visit    None       I am having Clayton Bach "[name]" maintain his clonazePAM, naproxen sodium, doxycycline, traMADol, hydrochlorothiazide, and emtricitabine-rilpivir-tenofovir DF.   No orders of the defined types were placed in this encounter.    Follow-up: No follow-ups on file.    Marcos Eke, MSN, FNP-C Nurse Practitioner [facility] for Infectious Disease [facility] Office phone: [phone] Pager: [phone] [facility] Main number: [phone]

## 2017-12-10 ENCOUNTER — Encounter: Payer: Self-pay | Admitting: Family

## 2017-12-13 ENCOUNTER — Encounter: Payer: Self-pay | Admitting: Infectious Diseases

## 2017-12-13 ENCOUNTER — Ambulatory Visit (INDEPENDENT_AMBULATORY_CARE_PROVIDER_SITE_OTHER): Payer: Self-pay | Admitting: Infectious Diseases

## 2017-12-13 VITALS — BP 158/107 | HR 97 | Temp 98.6°F | Wt 177.0 lb

## 2017-12-13 DIAGNOSIS — B20 Human immunodeficiency virus [HIV] disease: Secondary | ICD-10-CM

## 2017-12-13 DIAGNOSIS — R7989 Other specified abnormal findings of blood chemistry: Secondary | ICD-10-CM

## 2017-12-13 DIAGNOSIS — I10 Essential (primary) hypertension: Secondary | ICD-10-CM

## 2017-12-13 DIAGNOSIS — Z202 Contact with and (suspected) exposure to infections with a predominantly sexual mode of transmission: Secondary | ICD-10-CM

## 2017-12-13 DIAGNOSIS — R3915 Urgency of urination: Secondary | ICD-10-CM

## 2017-12-13 MED ORDER — AMLODIPINE BESYLATE 5 MG PO TABS: 5.0000 mg | ORAL_TABLET | Freq: Every day | ORAL | 3 refills | Status: DC

## 2017-12-13 MED ORDER — EMTRICITAB-RILPIVIR-TENOFOV AF 200-25-25 MG PO TABS: 1.0000 | ORAL_TABLET | Freq: Every day | ORAL | 5 refills | Status: DC

## 2017-12-13 NOTE — Patient Instructions (Addendum)
Wonderful to meet you today.   Please call the [facility] on [address] to see if your ADAP has been approved. If not we can help enroll you in patient assistance to get your Charlett Lango so please call and talk with Morrie Sheldon or Cassie about this.   Will start you on a new blood pressure medication called Amlodipine today - take one pill once a day. Side effects can include constipation and some swelling in your legs.   Will see you back in 6 weeks.   Please call [facility] @ [phone] to schedule a dental appointment

## 2017-12-13 NOTE — Progress Notes (Addendum)
Name: Clayton Erickson  DOB: September 11, 1980 MRN: [id number] PCP: Tommy Medal, Lavell Islam, MD    Patient Active Problem List   Diagnosis Date Noted  . STD exposure 12/15/2017  . Low vitamin D level 12/15/2017  . Urinary urgency 12/15/2017  . Hypertension 08/10/2008  . 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 Human immunodeficiency virus (HIV) disease ([place]) 03/10/2006     Brief Narrative:  Clayton Erickson is a 37 y.o. man with HIV infection that was originally diagnosed in 2002. His CD4 nadir is "above 200". History of OIs: none. HIV Risk: MSM. Previously in care at [facility] P#[phone]  Previous Regimens: . Complera 2016 >> suppressed  . Odefsey 2018   Genotypes: . 10-2017 >> wildtype with no integrase R  Subjective:  [name] is here today to re-establish care for HIV. He was last seen by Dr. Tommy Medal in 2016 and was taking Complera then. Since then he moved to [place] and [place] and was transitioned to [place]. He has been doing well with Vernell Leep but is concerned that he is having side effects related to use including left arm pain that feels like a string pulling his arm. He is right handed. He is worried about the commercials about TDF on television and would like to discuss further. Has been off medication for a month or so and met with Juliann Pulse for [place] in 10-2017. He is uncertain if this is approved yet. Reports no complaints today suggestive of associated opportunistic infection or advancing HIV disease such as fevers, night sweats, weight loss, anorexia, cough, SOB, nausea, vomiting, diarrhea, headache, sensory changes, lymphadenopathy or oral thrush.  Has had some problems with adherence based on his previous notes.   Has high blood pressure and was previously maintained on HCTZ in the past but it makes him urinate too frequently. He also describes that over the last 2 years he has had increasing trouble with urgency. He is not to  the point of being incontinent but has had very close calls and has had to use other containers to void if a bathroom was not in immediate access for him.His partner was recently treated for chlamydia and he is requesting presumptive treatment for this today. Denies dysuria or urethral drainage but does report some scrotal pain and "heaviness."  Past medical hx includes HTN (previously on HCTZ), vitamin D deficiency (previously on weekly tx). He is not on treatment for either currently.   Review of Systems  Constitutional: Negative for chills, fever, malaise/fatigue and weight loss.  HENT: Negative for sore throat.        No dental problems  Respiratory: Negative for cough and sputum production.   Cardiovascular: Negative for chest pain and leg swelling.  Gastrointestinal: Negative for abdominal pain, diarrhea and vomiting.  Genitourinary: Positive for urgency. Negative for dysuria and flank pain.       Scrotal heaviness  Musculoskeletal: Negative for joint pain, myalgias and neck pain.  Skin: Negative for rash.  Neurological: Negative for dizziness, tingling and headaches.  Psychiatric/Behavioral: Negative for depression and substance abuse. The patient is not nervous/anxious and does not have insomnia.     Past Medical History:  Diagnosis Date  . Boils   . HIV infection ([place])   . Hypertension   . Low vitamin D level     Outpatient Medications Prior to Visit  Medication Sig Dispense Refill  . emtricitabine-rilpivir-tenofovir AF (ODEFSEY) 200-25-25 MG TABS tablet Take 1 tablet by mouth daily  with breakfast.    . clonazePAM (KLONOPIN) 0.5 MG tablet Take 1 tablet (0.5 mg total) by mouth daily as needed for anxiety. (Patient not taking: Reported on 12/13/2017) 30 tablet 4  . doxycycline (VIBRA-TABS) 100 MG tablet Take 1 tablet (100 mg total) by mouth 2 (two) times daily. (Patient not taking: Reported on 12/13/2017) 28 tablet 1  . hydrochlorothiazide (HYDRODIURIL) 25 MG tablet TAKE 1  TABLET BY MOUTH EVERY DAY (Patient not taking: Reported on 12/13/2017) 30 tablet 1  . traMADol (ULTRAM) 50 MG tablet Take 1 tablet (50 mg total) by mouth 2 (two) times daily. (Patient not taking: Reported on 12/13/2017) 30 tablet 0  . Emtricitab-Rilpivir-Tenofov DF (COMPLERA) 200-25-300 MG TABS TAKE 1 TABLET BY MOUTH DAILY WITH A 400 CALORIE MEAL (Patient not taking: Reported on 12/13/2017) 30 tablet 1  . naproxen sodium (ANAPROX) 220 MG tablet Take 220 mg by mouth as needed (headache).     No facility-administered medications prior to visit.      No Known Allergies  Social History   Tobacco Use  . Smoking status: Former Smoker    Types: Cigarettes  . Smokeless tobacco: Never Used  Substance Use Topics  . Alcohol use: Yes  . Drug use: Not Currently    Frequency: 30.0 times per week    Types: Marijuana    Comment: stopped a month a a half ago    Family History  Problem Relation Age of Onset  . Hypertension Mother     Social History   Substance and Sexual Activity  Sexual Activity Yes  . Partners: Male   Comment: declined condoms     Objective:   Vitals:   12/13/17 1053  BP: (!) 158/107  Pulse: 97  Temp: 98.6 F (37 C)  TempSrc: Oral  Weight: 177 lb (80.3 kg)   Body mass index is 24.69 kg/m.  Physical Exam  Constitutional: He is oriented to person, place, and time. He appears well-developed and well-nourished. No distress.  HENT:  Mouth/Throat: Oropharynx is clear and moist. No oral lesions. Normal dentition. No dental caries.  Eyes: Pupils are equal, round, and reactive to light. No scleral icterus.  Cardiovascular: Normal rate, regular rhythm and normal heart sounds.  Pulmonary/Chest: Effort normal and breath sounds normal.  Abdominal: Soft. He exhibits no distension. There is no tenderness.  Musculoskeletal: Normal range of motion.  Lymphadenopathy:    He has no cervical adenopathy.  Neurological: He is alert and oriented to person, place, and time.    Skin: Skin is warm and dry. No rash noted.  Psychiatric: He has a normal mood and affect. Thought content normal.  Vitals reviewed.   Lab Results Lab Results  Component Value Date   WBC 12.3 (H) 11/18/2017   HGB 14.5 11/18/2017   HCT 41.4 11/18/2017   MCV 82.3 11/18/2017   PLT 135 (L) 11/18/2017    Lab Results  Component Value Date   CREATININE 1.08 11/18/2017   BUN 11 11/18/2017   NA 133 (L) 11/18/2017   K 4.0 11/18/2017   CL 100 11/18/2017   CO2 26 11/18/2017    Lab Results  Component Value Date   ALT 18 11/18/2017   AST 22 11/18/2017   ALKPHOS 55 09/06/2014   BILITOT 0.8 11/18/2017    Lab Results  Component Value Date   CHOL 175 09/06/2014   HDL 88 09/06/2014   LDLCALC 74 09/06/2014   TRIG 66 09/06/2014   CHOLHDL 2.0 09/06/2014   HIV 1 RNA Quant (  copies/mL)  Date Value  11/18/2017 3,340 (H)  09/06/2014 <20  05/16/2014 26,035 (H)   CD4 T Cell Abs (/uL)  Date Value  11/18/2017 790  09/06/2014 1,090  05/16/2014 1,000     Assessment & Plan:   Problem List Items Addressed This Visit      Unprioritized   Human immunodeficiency virus (HIV) disease ([place]) - Primary    Patient with intermittently well controlled HIV infection since 2002 here today to transfer care. Has had some documented trouble with adherence. Previously on Odefsey and VL < 20. Will refill for him today and send to [facility]. He has been approved for [employer] while we await HMAP approval. We will obtain previous records and have him return for follow up on HTN in 6 weeks and provide any necessary vaccines at that time. Condoms provided.   Long discussion about TDF vs TAF today and explanation of TV ads regarding litigation against TDF. He is agreeable to continue Odefsey.       Relevant Medications   emtricitabine-rilpivir-tenofovir AF (ODEFSEY) 200-25-25 MG TABS tablet   azithromycin (ZITHROMAX) tablet 1,000 mg (Completed)   cefTRIAXone (ROCEPHIN) injection  250 mg (Completed)   Other Relevant Orders   Cytology (oral, anal, urethral) ancillary only   Cytology (oral, anal, urethral) ancillary only (Completed)   Cytology (oral, anal, urethral) ancillary only (Completed)   Hypertension    Will try him on amlodipine 5 mg QD as he did not like the HCTZ with urinary symptoms he is experiencing. Return in 6 weeks to recheck BP and assess need for dose increase.       Relevant Medications   amLODipine (NORVASC) 5 MG tablet   Low vitamin D level    Records reviewed - previous level only 9. Will re-check with next lab draw. Recommended OTC supplementation of 1000 IU daily for now. May need more.       STD exposure    Will give treatment with risk of exposure to gonorrhea today - treated with 1gm Azithromycin and 250 mg IM rocephin x 1. Safe sex discussed and oral/rectal/urine swabs obtained today.       Relevant Medications   azithromycin (ZITHROMAX) tablet 1,000 mg (Completed)   cefTRIAXone (ROCEPHIN) injection 250 mg (Completed)   Urinary urgency    STI treatment given today and stopped his diuretic. Will refer to urology for further eval/management.         Return in about 6 weeks (around 01/24/2018).  Needs flu shot at this visit. Will also offer HPV series.   Janene Madeira, MSN, NP-C [facility] [place] Pager: [phone] Office: [phone]  12/15/17  4:25 PM

## 2017-12-14 ENCOUNTER — Encounter: Payer: Self-pay | Admitting: Infectious Diseases

## 2017-12-14 LAB — CYTOLOGY, (ORAL, ANAL, URETHRAL) ANCILLARY ONLY
Chlamydia: NEGATIVE
Chlamydia: NEGATIVE
NEISSERIA GONORRHEA: NEGATIVE
Neisseria Gonorrhea: NEGATIVE

## 2017-12-14 MED ORDER — AZITHROMYCIN 250 MG PO TABS
1000.0000 mg | ORAL_TABLET | Freq: Once | ORAL | Status: AC
  Administered 2017-12-13: 1000 mg via ORAL

## 2017-12-14 MED ORDER — CEFTRIAXONE SODIUM 250 MG IJ SOLR
250.0000 mg | Freq: Once | INTRAMUSCULAR | Status: AC
  Administered 2017-12-13: 250 mg via INTRAMUSCULAR

## 2017-12-15 ENCOUNTER — Encounter: Payer: Self-pay | Admitting: Infectious Diseases

## 2017-12-15 DIAGNOSIS — R7989 Other specified abnormal findings of blood chemistry: Secondary | ICD-10-CM | POA: Insufficient documentation

## 2017-12-15 DIAGNOSIS — R3915 Urgency of urination: Secondary | ICD-10-CM | POA: Insufficient documentation

## 2017-12-15 DIAGNOSIS — Z202 Contact with and (suspected) exposure to infections with a predominantly sexual mode of transmission: Secondary | ICD-10-CM | POA: Insufficient documentation

## 2017-12-15 NOTE — Assessment & Plan Note (Signed)
STI treatment given today and stopped his diuretic. Will refer to urology for further eval/management.

## 2017-12-15 NOTE — Assessment & Plan Note (Signed)
Will give treatment with risk of exposure to gonorrhea today - treated with 1gm Azithromycin and 250 mg IM rocephin x 1. Safe sex discussed and oral/rectal/urine swabs obtained today.

## 2017-12-15 NOTE — Assessment & Plan Note (Addendum)
Patient with intermittently well controlled HIV infection since 2002 here today to transfer care. Has had some documented trouble with adherence. Previously on Odefsey and VL < 20. Will refill for him today and send to [facility] pharmacy. He has been approved for [employer] while we await HMAP approval. We will obtain previous records and have him return for follow up on HTN in 6 weeks and provide any necessary vaccines at that time. Condoms provided.   Long discussion about TDF vs TAF today and explanation of TV ads regarding litigation against TDF. He is agreeable to continue Odefsey.

## 2017-12-15 NOTE — Assessment & Plan Note (Signed)
Records reviewed - previous level only 9. Will re-check with next lab draw. Recommended OTC supplementation of 1000 IU daily for now. May need more.

## 2017-12-15 NOTE — Assessment & Plan Note (Signed)
Will try him on amlodipine 5 mg QD as he did not like the HCTZ with urinary symptoms he is experiencing. Return in 6 weeks to recheck BP and assess need for dose increase.

## 2018-01-04 ENCOUNTER — Telehealth: Payer: Self-pay

## 2018-01-04 NOTE — Telephone Encounter (Signed)
Patient called upset stating he is still waiting on his ADAP approval to get his medication. Patient states that he saw our Financial counselor back in September, and was never called with an update. Spoke with Olegario Messier, financial counselor who states ADAP was approved on 10/22. Informed patient who states he will call his pharmacy with this information. Lorenso Courier, [place]

## 2018-01-22 ENCOUNTER — Other Ambulatory Visit: Payer: Self-pay

## 2018-01-22 ENCOUNTER — Emergency Department (HOSPITAL_COMMUNITY)
Admission: EM | Admit: 2018-01-22 | Discharge: 2018-01-22 | Disposition: A | Discharge status: Home / Self Care | Payer: Self-pay | Attending: Emergency Medicine | Admitting: Emergency Medicine

## 2018-01-22 DIAGNOSIS — Y998 Other external cause status: Secondary | ICD-10-CM | POA: Insufficient documentation

## 2018-01-22 DIAGNOSIS — Y93G9 Activity, other involving cooking and grilling: Secondary | ICD-10-CM | POA: Insufficient documentation

## 2018-01-22 DIAGNOSIS — Z87891 Personal history of nicotine dependence: Secondary | ICD-10-CM | POA: Insufficient documentation

## 2018-01-22 DIAGNOSIS — T23201A Burn of second degree of right hand, unspecified site, initial encounter: Secondary | ICD-10-CM | POA: Insufficient documentation

## 2018-01-22 DIAGNOSIS — Y92000 Kitchen of unspecified non-institutional (private) residence as  the place of occurrence of the external cause: Secondary | ICD-10-CM | POA: Insufficient documentation

## 2018-01-22 DIAGNOSIS — Z79899 Other long term (current) drug therapy: Secondary | ICD-10-CM | POA: Insufficient documentation

## 2018-01-22 DIAGNOSIS — T25222A Burn of second degree of left foot, initial encounter: Secondary | ICD-10-CM | POA: Insufficient documentation

## 2018-01-22 DIAGNOSIS — Z23 Encounter for immunization: Secondary | ICD-10-CM | POA: Insufficient documentation

## 2018-01-22 DIAGNOSIS — X102XXA Contact with fats and cooking oils, initial encounter: Secondary | ICD-10-CM | POA: Insufficient documentation

## 2018-01-22 MED ORDER — BACITRACIN ZINC 500 UNIT/GM EX OINT: 1.0000 "application " | TOPICAL_OINTMENT | Freq: Three times a day (TID) | CUTANEOUS | 0 refills | Status: AC

## 2018-01-22 MED ORDER — LIDOCAINE HCL 2 % IJ SOLN
10.0000 mL | Freq: Once | INTRAMUSCULAR | Status: AC
  Administered 2018-01-22: 200 mg via INTRADERMAL
  Filled 2018-01-22: qty 20

## 2018-01-22 MED ORDER — OXYCODONE-ACETAMINOPHEN 5-325 MG PO TABS: 1.0000 | ORAL_TABLET | Freq: Three times a day (TID) | ORAL | 0 refills | Status: AC | PRN

## 2018-01-22 MED ORDER — BACITRACIN ZINC 500 UNIT/GM EX OINT: TOPICAL_OINTMENT | Freq: Two times a day (BID) | CUTANEOUS | Status: DC

## 2018-01-22 MED ORDER — FENTANYL CITRATE (PF) 100 MCG/2ML IJ SOLN
75.0000 ug | Freq: Once | INTRAMUSCULAR | Status: AC
  Administered 2018-01-22: 75 ug via INTRAVENOUS
  Filled 2018-01-22: qty 2

## 2018-01-22 MED ORDER — OXYCODONE-ACETAMINOPHEN 5-325 MG PO TABS
1.0000 | ORAL_TABLET | Freq: Once | ORAL | Status: AC
  Administered 2018-01-22: 1 via ORAL
  Filled 2018-01-22: qty 1

## 2018-01-22 MED ORDER — ACETAMINOPHEN 500 MG PO TABS: 1000.0000 mg | ORAL_TABLET | Freq: Three times a day (TID) | ORAL | 0 refills | Status: AC

## 2018-01-22 MED ORDER — BACITRACIN 500 UNIT/GM EX OINT
TOPICAL_OINTMENT | Freq: Two times a day (BID) | CUTANEOUS | Status: DC
  Administered 2018-01-22: 06:00:00 via TOPICAL
  Filled 2018-01-22: qty 14

## 2018-01-22 MED ORDER — HYDROMORPHONE HCL 1 MG/ML IJ SOLN
0.5000 mg | Freq: Once | INTRAMUSCULAR | Status: AC
  Administered 2018-01-22: 0.5 mg via INTRAVENOUS
  Filled 2018-01-22: qty 1

## 2018-01-22 MED ORDER — SODIUM CHLORIDE 0.9 % IV BOLUS
1000.0000 mL | Freq: Once | INTRAVENOUS | Status: AC
  Administered 2018-01-22: 1000 mL via INTRAVENOUS

## 2018-01-22 MED ORDER — TETANUS-DIPHTH-ACELL PERTUSSIS 5-2.5-18.5 LF-MCG/0.5 IM SUSP
0.5000 mL | Freq: Once | INTRAMUSCULAR | Status: AC
  Administered 2018-01-22: 0.5 mL via INTRAMUSCULAR
  Filled 2018-01-22: qty 0.5

## 2018-01-22 MED ORDER — SILVER SULFADIAZINE 1 % EX CREA
TOPICAL_CREAM | Freq: Every day | CUTANEOUS | Status: DC
  Filled 2018-01-22: qty 50
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
## 2018-01-22 NOTE — ED Notes (Signed)
Patient given specific wound care instructions for today and Sunday and to call Plastic Surgeon on Monday for F/U within 3-5 days-patient verbalizes understanding
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
## 2018-01-25 ENCOUNTER — Telehealth: Payer: Self-pay

## 2018-01-25 ENCOUNTER — Ambulatory Visit (INDEPENDENT_AMBULATORY_CARE_PROVIDER_SITE_OTHER): Payer: Self-pay | Admitting: Family

## 2018-01-25 ENCOUNTER — Encounter: Payer: Self-pay | Admitting: Family

## 2018-01-25 VITALS — BP 134/77 | HR 91 | Temp 98.2°F | Wt 180.0 lb

## 2018-01-25 DIAGNOSIS — T25222A Burn of second degree of left foot, initial encounter: Secondary | ICD-10-CM

## 2018-01-25 DIAGNOSIS — Z202 Contact with and (suspected) exposure to infections with a predominantly sexual mode of transmission: Secondary | ICD-10-CM

## 2018-01-25 DIAGNOSIS — T23241A Burn of second degree of multiple right fingers (nail), including thumb, initial encounter: Secondary | ICD-10-CM

## 2018-01-25 MED ORDER — PENICILLIN G BENZATHINE 1200000 UNIT/2ML IM SUSP
1.2000 10*6.[IU] | Freq: Once | INTRAMUSCULAR | Status: AC
  Administered 2018-01-25: 1.2 10*6.[IU] via INTRAMUSCULAR

## 2018-01-25 NOTE — Patient Instructions (Signed)
Nice to see you.  I am sorry about your hand.   We have printed out the coupon for you.  Continue with wound care as instructed.   Would recommend at least calling the plastic surgeon.   We will check your lab work today.  Plan for follow up office visit in 2 weeks for a wound check with Rexene AlbertsStephanie Dixon, NP.
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
## 2018-01-26 ENCOUNTER — Encounter: Payer: Self-pay | Admitting: Family

## 2018-01-26 DIAGNOSIS — T25222A Burn of second degree of left foot, initial encounter: Secondary | ICD-10-CM | POA: Insufficient documentation

## 2018-01-26 DIAGNOSIS — T23241A Burn of second degree of multiple right fingers (nail), including thumb, initial encounter: Secondary | ICD-10-CM | POA: Insufficient documentation

## 2018-01-26 LAB — RPR: RPR Ser Ql: NONREACTIVE
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
## 2018-01-28 LAB — URINE CYTOLOGY ANCILLARY ONLY
Chlamydia: NEGATIVE
Neisseria Gonorrhea: NEGATIVE

## 2018-02-02 ENCOUNTER — Encounter (HOSPITAL_COMMUNITY): Payer: Self-pay | Admitting: Emergency Medicine

## 2018-02-02 ENCOUNTER — Emergency Department (HOSPITAL_COMMUNITY)
Admission: EM | Admit: 2018-02-02 | Discharge: 2018-02-02 | Disposition: A | Discharge status: Home / Self Care | Payer: Self-pay | Attending: Emergency Medicine | Admitting: Emergency Medicine

## 2018-02-02 ENCOUNTER — Other Ambulatory Visit: Payer: Self-pay

## 2018-02-02 DIAGNOSIS — T300 Burn of unspecified body region, unspecified degree: Secondary | ICD-10-CM

## 2018-02-02 DIAGNOSIS — Z79899 Other long term (current) drug therapy: Secondary | ICD-10-CM | POA: Insufficient documentation

## 2018-02-02 DIAGNOSIS — Y33XXXA Other specified events, undetermined intent, initial encounter: Secondary | ICD-10-CM | POA: Insufficient documentation

## 2018-02-02 DIAGNOSIS — X19XXXD Contact with other heat and hot substances, subsequent encounter: Secondary | ICD-10-CM | POA: Insufficient documentation

## 2018-02-02 DIAGNOSIS — Z87891 Personal history of nicotine dependence: Secondary | ICD-10-CM | POA: Insufficient documentation

## 2018-02-02 DIAGNOSIS — T23201D Burn of second degree of right hand, unspecified site, subsequent encounter: Secondary | ICD-10-CM | POA: Insufficient documentation

## 2018-02-02 DIAGNOSIS — I10 Essential (primary) hypertension: Secondary | ICD-10-CM | POA: Insufficient documentation

## 2018-02-02 DIAGNOSIS — B20 Human immunodeficiency virus [HIV] disease: Secondary | ICD-10-CM | POA: Insufficient documentation

## 2018-02-02 DIAGNOSIS — M79641 Pain in right hand: Secondary | ICD-10-CM | POA: Insufficient documentation

## 2018-02-02 MED ORDER — BACITRACIN ZINC 500 UNIT/GM EX OINT
TOPICAL_OINTMENT | CUTANEOUS | Status: AC
  Administered 2018-02-02
  Filled 2018-02-02: qty 0.9

## 2018-02-02 MED ORDER — OXYCODONE-ACETAMINOPHEN 5-325 MG PO TABS
1.0000 | ORAL_TABLET | Freq: Once | ORAL | Status: AC
  Administered 2018-02-02: 1 via ORAL
  Filled 2018-02-02: qty 1
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
## 2018-02-02 NOTE — ED Notes (Signed)
Bed: [facility] Expected date:  Expected time:  Means of arrival:  Comments: 
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
## 2018-02-07 ENCOUNTER — Ambulatory Visit: Payer: Self-pay | Admitting: Infectious Diseases

## 2018-02-14 ENCOUNTER — Ambulatory Visit (INDEPENDENT_AMBULATORY_CARE_PROVIDER_SITE_OTHER): Payer: Self-pay | Admitting: Infectious Diseases

## 2018-02-14 ENCOUNTER — Encounter: Payer: Self-pay | Admitting: Infectious Diseases

## 2018-02-14 VITALS — BP 147/98 | HR 80 | Temp 97.5°F | Wt 173.0 lb

## 2018-02-14 DIAGNOSIS — T3122 Burns involving 20-29% of body surface with 20-29% third degree burns: Secondary | ICD-10-CM

## 2018-02-14 DIAGNOSIS — T23241D Burn of second degree of multiple right fingers (nail), including thumb, subsequent encounter: Secondary | ICD-10-CM

## 2018-02-14 DIAGNOSIS — R3915 Urgency of urination: Secondary | ICD-10-CM

## 2018-02-14 DIAGNOSIS — T23201A Burn of second degree of right hand, unspecified site, initial encounter: Secondary | ICD-10-CM | POA: Insufficient documentation

## 2018-02-14 DIAGNOSIS — Z79899 Other long term (current) drug therapy: Secondary | ICD-10-CM

## 2018-02-14 DIAGNOSIS — T23361D Burn of third degree of back of right hand, subsequent encounter: Secondary | ICD-10-CM

## 2018-02-14 DIAGNOSIS — B20 Human immunodeficiency virus [HIV] disease: Secondary | ICD-10-CM

## 2018-02-14 DIAGNOSIS — F411 Generalized anxiety disorder: Secondary | ICD-10-CM

## 2018-02-14 DIAGNOSIS — X088XXD Exposure to other specified smoke, fire and flames, subsequent encounter: Secondary | ICD-10-CM

## 2018-02-14 DIAGNOSIS — I10 Essential (primary) hypertension: Secondary | ICD-10-CM

## 2018-02-14 DIAGNOSIS — Z23 Encounter for immunization: Secondary | ICD-10-CM

## 2018-02-14 MED ORDER — CLONAZEPAM 0.5 MG PO TABS: 0.5000 mg | ORAL_TABLET | Freq: Every day | ORAL | 0 refills | Status: DC | PRN

## 2018-02-14 MED ORDER — EMTRICITAB-RILPIVIR-TENOFOV AF 200-25-25 MG PO TABS: 1.0000 | ORAL_TABLET | Freq: Every day | ORAL | 11 refills | Status: DC

## 2018-02-14 MED ORDER — AMLODIPINE BESYLATE 5 MG PO TABS: 5.0000 mg | ORAL_TABLET | Freq: Every day | ORAL | 11 refills | Status: DC

## 2018-02-14 MED ORDER — GABAPENTIN 300 MG PO CAPS: 300.0000 mg | ORAL_CAPSULE | Freq: Three times a day (TID) | ORAL | 3 refills | Status: DC

## 2018-02-14 NOTE — Progress Notes (Signed)
I wish he had come back to see me earlier so we could have arranged sooner after his ER visit - got him a visit on 12/30 scheduled today.
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
## 2018-02-14 NOTE — Assessment & Plan Note (Signed)
Will refill amlodipine 5 mg QD - he has been out so BP just above target. Will reassess in 3732m.
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
## 2018-02-14 NOTE — Assessment & Plan Note (Signed)
Refills given for Odefsey. He already has an [facility]. Will have him return in 3 months barring he has what he needs for wound care/support and works regularly with [facility] for anxiety management.  Will check labs at upcoming appointment.  Flu vaccine today. Otherwise up to date for vaccines for [facility].
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
## 2018-03-07 ENCOUNTER — Ambulatory Visit: Payer: Self-pay

## 2018-04-15 ENCOUNTER — Ambulatory Visit: Payer: Self-pay | Admitting: Infectious Diseases

## 2018-05-30 ENCOUNTER — Ambulatory Visit: Payer: Self-pay | Admitting: Infectious Diseases

## 2018-06-16 ENCOUNTER — Telehealth: Payer: Self-pay

## 2018-06-16 NOTE — Telephone Encounter (Signed)
Called [name] in attempt to schedule appointment to continue care. Patient has a history of missed appointments.  Gerarda Fraction, [place]

## 2018-06-29 ENCOUNTER — Other Ambulatory Visit: Payer: Self-pay

## 2018-06-29 DIAGNOSIS — B20 Human immunodeficiency virus [HIV] disease: Secondary | ICD-10-CM

## 2018-06-29 DIAGNOSIS — Z113 Encounter for screening for infections with a predominantly sexual mode of transmission: Secondary | ICD-10-CM

## 2018-06-30 ENCOUNTER — Ambulatory Visit: Payer: Self-pay

## 2018-06-30 ENCOUNTER — Other Ambulatory Visit: Payer: Self-pay

## 2018-07-04 ENCOUNTER — Ambulatory Visit: Payer: Self-pay | Admitting: Infectious Diseases

## 2018-07-07 ENCOUNTER — Other Ambulatory Visit: Payer: Self-pay

## 2018-07-07 ENCOUNTER — Telehealth: Payer: Self-pay

## 2018-07-07 ENCOUNTER — Encounter: Payer: Self-pay | Admitting: Infectious Diseases

## 2018-07-07 DIAGNOSIS — R369 Urethral discharge, unspecified: Secondary | ICD-10-CM

## 2018-07-07 DIAGNOSIS — Z113 Encounter for screening for infections with a predominantly sexual mode of transmission: Secondary | ICD-10-CM

## 2018-07-07 DIAGNOSIS — B20 Human immunodeficiency virus [HIV] disease: Secondary | ICD-10-CM

## 2018-07-07 MED ORDER — CEFTRIAXONE SODIUM 250 MG IJ SOLR
250.0000 mg | Freq: Once | INTRAMUSCULAR | Status: AC
  Administered 2018-07-07: 250 mg via INTRAMUSCULAR

## 2018-07-07 MED ORDER — AZITHROMYCIN 250 MG PO TABS
1000.0000 mg | ORAL_TABLET | Freq: Once | ORAL | Status: AC
  Administered 2018-07-07: 1000 mg via ORAL

## 2018-07-07 NOTE — Progress Notes (Signed)
See note
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
## 2018-07-07 NOTE — Progress Notes (Unsigned)
Patient is having penile discharge . Labs added and treatment given per [name], NP  Laurell Josephs, RN

## 2018-07-07 NOTE — Telephone Encounter (Signed)
Thank you Tammy!

## 2018-07-08 LAB — URINE CYTOLOGY ANCILLARY ONLY
Chlamydia: NEGATIVE
Neisseria Gonorrhea: NEGATIVE

## 2018-07-08 LAB — T-HELPER CELLS (CD4) COUNT (NOT AT ARMC)
CD4 % Helper T Cell: 30 % — ABNORMAL LOW (ref 33–65)
CD4 T Cell Abs: 871 /uL (ref 400–1790)

## 2018-07-18 LAB — CBC WITH DIFFERENTIAL/PLATELET
Absolute Monocytes: 728 cells/uL (ref 200–950)
Basophils Absolute: 52 cells/uL (ref 0–200)
Basophils Relative: 0.5 %
Eosinophils Absolute: 374 cells/uL (ref 15–500)
Eosinophils Relative: 3.6 %
HCT: 43.2 % (ref 38.5–50.0)
Hemoglobin: 15.1 g/dL (ref 13.2–17.1)
Lymphs Abs: 3245 cells/uL (ref 850–3900)
MCH: 28.8 pg (ref 27.0–33.0)
MCHC: 35 g/dL (ref 32.0–36.0)
MCV: 82.3 fL (ref 80.0–100.0)
MPV: 9.9 fL (ref 7.5–12.5)
Monocytes Relative: 7 %
Neutro Abs: 6001 cells/uL (ref 1500–7800)
Neutrophils Relative %: 57.7 %
Platelets: 147 10*3/uL (ref 140–400)
RBC: 5.25 10*6/uL (ref 4.20–5.80)
RDW: 13.5 % (ref 11.0–15.0)
Total Lymphocyte: 31.2 %
WBC: 10.4 10*3/uL (ref 3.8–10.8)

## 2018-07-18 LAB — COMPREHENSIVE METABOLIC PANEL
AG Ratio: 1.7 (calc) (ref 1.0–2.5)
ALT: 14 U/L (ref 9–46)
AST: 19 U/L (ref 10–40)
Albumin: 4.8 g/dL (ref 3.6–5.1)
Alkaline phosphatase (APISO): 56 U/L (ref 36–130)
BUN: 10 mg/dL (ref 7–25)
CO2: 29 mmol/L (ref 20–32)
Calcium: 9.4 mg/dL (ref 8.6–10.3)
Chloride: 99 mmol/L (ref 98–110)
Creat: 0.87 mg/dL (ref 0.60–1.35)
Globulin: 2.9 g/dL (calc) (ref 1.9–3.7)
Glucose, Bld: 89 mg/dL (ref 65–99)
Potassium: 4.1 mmol/L (ref 3.5–5.3)
Sodium: 134 mmol/L — ABNORMAL LOW (ref 135–146)
Total Bilirubin: 1 mg/dL (ref 0.2–1.2)
Total Protein: 7.7 g/dL (ref 6.1–8.1)

## 2018-07-18 LAB — HIV-1 RNA QUANT-NO REFLEX-BLD
HIV 1 RNA Quant: 3100 copies/mL — ABNORMAL HIGH
HIV-1 RNA Quant, Log: 3.49 Log copies/mL — ABNORMAL HIGH

## 2018-07-18 LAB — RPR: RPR Ser Ql: NONREACTIVE

## 2018-07-21 ENCOUNTER — Telehealth: Payer: Self-pay | Admitting: Internal Medicine

## 2018-07-21 NOTE — Telephone Encounter (Signed)
COVID-19 Pre-Screening Questions: ° °Do you currently have a fever (>100 °F), chills or unexplained body aches? No  ° °Are you currently experiencing new cough, shortness of breath, sore throat, runny nose? No  °•  °Have you recently travelled outside the state of [place] in the last 14 days? No  °•  °Have you been in contact with someone that is currently pending confirmation of Covid19 testing or has been confirmed to have the Covid19 virus?  No  °

## 2018-07-26 ENCOUNTER — Encounter: Payer: Self-pay | Admitting: Infectious Diseases

## 2018-07-27 ENCOUNTER — Other Ambulatory Visit: Payer: Self-pay | Admitting: Infectious Disease

## 2018-07-27 DIAGNOSIS — B20 Human immunodeficiency virus [HIV] disease: Secondary | ICD-10-CM

## 2018-07-27 NOTE — Addendum Note (Signed)
Addended by: Mariea Clonts D on: 07/27/2018 12:20 PM   Modules accepted: Orders

## 2018-07-28 ENCOUNTER — Ambulatory Visit: Payer: Self-pay | Admitting: Infectious Diseases

## 2018-08-08 LAB — HIV-1 GENOTYPE: HIV-1 Genotype: DETECTED — AB

## 2018-08-25 ENCOUNTER — Ambulatory Visit (INDEPENDENT_AMBULATORY_CARE_PROVIDER_SITE_OTHER): Payer: Self-pay | Admitting: Pharmacist

## 2018-08-25 ENCOUNTER — Other Ambulatory Visit: Payer: Self-pay

## 2018-08-25 DIAGNOSIS — B20 Human immunodeficiency virus [HIV] disease: Secondary | ICD-10-CM
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
## 2018-09-22 ENCOUNTER — Ambulatory Visit: Payer: Self-pay | Admitting: Infectious Diseases

## 2018-10-28 ENCOUNTER — Other Ambulatory Visit: Payer: Self-pay | Admitting: *Deleted

## 2018-10-28 DIAGNOSIS — B20 Human immunodeficiency virus [HIV] disease: Secondary | ICD-10-CM

## 2018-11-01 ENCOUNTER — Other Ambulatory Visit: Payer: Self-pay

## 2018-11-15 ENCOUNTER — Ambulatory Visit (INDEPENDENT_AMBULATORY_CARE_PROVIDER_SITE_OTHER): Payer: Self-pay | Admitting: Infectious Diseases

## 2018-11-15 ENCOUNTER — Ambulatory Visit: Payer: Self-pay

## 2018-11-15 ENCOUNTER — Encounter: Payer: Self-pay | Admitting: Infectious Diseases

## 2018-11-15 ENCOUNTER — Other Ambulatory Visit: Payer: Self-pay

## 2018-11-15 DIAGNOSIS — F411 Generalized anxiety disorder: Secondary | ICD-10-CM

## 2018-11-15 DIAGNOSIS — H0011 Chalazion right upper eyelid: Secondary | ICD-10-CM

## 2018-11-15 DIAGNOSIS — Z23 Encounter for immunization: Secondary | ICD-10-CM

## 2018-11-15 DIAGNOSIS — I10 Essential (primary) hypertension: Secondary | ICD-10-CM

## 2018-11-15 DIAGNOSIS — B20 Human immunodeficiency virus [HIV] disease: Secondary | ICD-10-CM

## 2018-11-15 MED ORDER — BIKTARVY 50-200-25 MG PO TABS: 1.0000 | ORAL_TABLET | Freq: Every day | ORAL | 5 refills | Status: DC

## 2018-11-15 MED ORDER — ATENOLOL 50 MG PO TABS: 50.0000 mg | ORAL_TABLET | Freq: Every day | ORAL | 5 refills | Status: DC

## 2018-11-15 MED ORDER — HYDROXYZINE PAMOATE 25 MG PO CAPS: 25.0000 mg | ORAL_CAPSULE | Freq: Three times a day (TID) | ORAL | 2 refills | Status: DC | PRN

## 2018-11-15 NOTE — Patient Instructions (Addendum)
Biktarvy is the pill I would like for you to start taking to treat you - this will need to be taken once a day around the same time.  - Common side effects for a short time frame usually include headaches, nausea and diarrhea - OK to take over the counter tylenol for headaches and imodium for diarrhea - Try taking with food if you are nauseated  -If you take any multivitamins or supplements please separate them from your Biktarvy by 6 hours before and after.  The main thing is do not have them in the stomach at the same time.  Stop your Vernell Leep once you get this new medication.    For your blood pressure - Please stop the amlodipine and start taking the atenolol once a day. We may need to double this when you come back for a check in 6 weeks.  Take this one a day.  I think if we can help get your blood pressure under better control your chest pain and arm pain will go away. They may be related. If you experience pain like that that does not go away - this is when you need to go to the ER to be seen.   Please come back in 6 weeks to check in again      Chalazion  A chalazion is a swelling or lump on the eyelid. It can affect the upper eyelid or the lower eyelid. What are the causes? This condition may be caused by:  Long-lasting (chronic) inflammation of the eyelid glands.  A blocked oil gland in the eyelid. What are the signs or symptoms? Symptoms of this condition include:  Swelling of the eyelid. The swelling may spread to areas around the eye.  A hard lump on the eyelid.  Blurry vision. The lump on the eyelid may make it hard to see out of the eye. How is this diagnosed? This condition is diagnosed with an examination of the eye. How is this treated? This condition is treated by applying a warm compress to the eyelid. If the condition does not improve, it may be treated with:  Medicine that is injected into the chalazion by a health care provider.  Surgery.  Medicine  that is applied to the eye. Follow these instructions at home: Managing pain and swelling  Apply a warm, moist compress to the eyelid 4-6 times a day for 10-15 minutes at a time. This will help to open any blocked glands and to reduce redness and swelling.  Apply over-the-counter and prescription medicines only as told by your health care provider. General instructions  Do not touch the chalazion.  Do not try to remove the pus. Do not squeeze the chalazion or stick it with a pin or needle.   Do not rub your eyes.  Wash your hands often. Dry your hands with a clean towel.  Keep your face, scalp, and eyebrows clean.  Avoid wearing eye makeup.  If the chalazion does not break open (rupture) on its own, return to your health care provider.  Keep all follow-up appointments as told by your health care provider. This is important. Contact a health care provider if:  Your eyelid has not improved in 4 weeks.  Your eyelid is getting worse.  You have a fever.  The chalazion does not rupture on its own after a month of home treatment. Summary  A chalazion is a swelling or lump on the upper or lower eyelid.  It may be caused by  chronic inflammation or a blocked oil gland.  Apply a warm, moist compress to the eyelid 4-6 times a day for 10-15 minutes at a time.  Keep your face, scalp, and eyebrows clean. This information is not intended to replace advice given to you by your health care provider. Make sure you discuss any questions you have with your health care provider. Document Released: 02/14/2000 Document Revised: 08/05/2017 Document Reviewed: 08/05/2017 Elsevier Patient Education  2020 [employer]
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
## 2018-11-17 LAB — COMPLETE METABOLIC PANEL WITH GFR
AG Ratio: 1.3 (calc) (ref 1.0–2.5)
ALT: 13 U/L (ref 9–46)
AST: 18 U/L (ref 10–40)
Albumin: 4.5 g/dL (ref 3.6–5.1)
Alkaline phosphatase (APISO): 54 U/L (ref 36–130)
BUN: 12 mg/dL (ref 7–25)
CO2: 27 mmol/L (ref 20–32)
Calcium: 10.2 mg/dL (ref 8.6–10.3)
Chloride: 97 mmol/L — ABNORMAL LOW (ref 98–110)
Creat: 1.07 mg/dL (ref 0.60–1.35)
GFR, Est African American: 102 mL/min/{1.73_m2} (ref >=60)
GFR, Est Non African American: 88 mL/min/{1.73_m2} (ref >=60)
Globulin: 3.6 g/dL (calc) (ref 1.9–3.7)
Glucose, Bld: 83 mg/dL (ref 65–99)
Potassium: 3.9 mmol/L (ref 3.5–5.3)
Sodium: 134 mmol/L — ABNORMAL LOW (ref 135–146)
Total Bilirubin: 0.9 mg/dL (ref 0.2–1.2)
Total Protein: 8.1 g/dL (ref 6.1–8.1)

## 2018-11-17 LAB — HELPER T-LYMPH-CD4 (ARMC ONLY)
% CD 4 Pos. Lymph.: 34 % (ref 30.8–58.5)
Absolute CD 4 Helper: 1530 /uL — ABNORMAL HIGH (ref 359–1519)
Basophils Absolute: 0.1 10*3/uL (ref 0.0–0.2)
Basos: 1 %
EOS (ABSOLUTE): 1.7 10*3/uL — ABNORMAL HIGH (ref 0.0–0.4)
Eos: 14 %
Hematocrit: 42 % (ref 37.5–51.0)
Hemoglobin: 14.3 g/dL (ref 13.0–17.7)
Immature Grans (Abs): 0.1 10*3/uL (ref 0.0–0.1)
Immature Granulocytes: 1 %
Lymphocytes Absolute: 4.5 10*3/uL — ABNORMAL HIGH (ref 0.7–3.1)
Lymphs: 36 %
MCH: 29.4 pg (ref 26.6–33.0)
MCHC: 34 g/dL (ref 31.5–35.7)
MCV: 86 fL (ref 79–97)
Monocytes Absolute: 0.7 10*3/uL (ref 0.1–0.9)
Monocytes: 6 %
Neutrophils Absolute: 5.6 10*3/uL (ref 1.4–7.0)
Neutrophils: 42 %
RBC: 4.86 x10E6/uL (ref 4.14–5.80)
RDW: 13.3 % (ref 11.6–15.4)
WBC: 12.7 10*3/uL — ABNORMAL HIGH (ref 3.4–10.8)

## 2018-11-17 LAB — CBC WITH DIFFERENTIAL/PLATELET
Absolute Monocytes: 718 cells/uL (ref 200–950)
Basophils Absolute: 120 cells/uL (ref 0–200)
Basophils Relative: 0.9 %
Eosinophils Absolute: 1809 cells/uL — ABNORMAL HIGH (ref 15–500)
Eosinophils Relative: 13.6 %
HCT: 42.2 % (ref 38.5–50.0)
Hemoglobin: 14.2 g/dL (ref 13.2–17.1)
Lymphs Abs: 4855 cells/uL — ABNORMAL HIGH (ref 850–3900)
MCH: 28.6 pg (ref 27.0–33.0)
MCHC: 33.6 g/dL (ref 32.0–36.0)
MCV: 84.9 fL (ref 80.0–100.0)
MPV: 10.2 fL (ref 7.5–12.5)
Monocytes Relative: 5.4 %
Neutro Abs: 5799 cells/uL (ref 1500–7800)
Neutrophils Relative %: 43.6 %
Platelets: 115 10*3/uL — ABNORMAL LOW (ref 140–400)
RBC: 4.97 10*6/uL (ref 4.20–5.80)
RDW: 13.4 % (ref 11.0–15.0)
Total Lymphocyte: 36.5 %
WBC: 13.3 10*3/uL — ABNORMAL HIGH (ref 3.8–10.8)

## 2018-11-17 LAB — HIV-1 RNA QUANT-NO REFLEX-BLD
HIV 1 RNA Quant: <20 copies/mL
HIV-1 RNA Quant, Log: <1.3 Log copies/mL

## 2018-11-18 DIAGNOSIS — H0013 Chalazion right eye, unspecified eyelid: Secondary | ICD-10-CM | POA: Insufficient documentation

## 2018-11-18 NOTE — Assessment & Plan Note (Signed)
I informed him today that I will not be giving him any clonazepam given his poor ability to follow up in clinic as requested. I also feel that if he needs benzos he should be managed by specialist team.  Refusing SSRI for baseline daily maintenance. Offered and refused counseling.  Will try hydroxyzine TID PRN.

## 2018-11-18 NOTE — Assessment & Plan Note (Signed)
Discussed management with [name]. Stop picking/touching the lesion with hands. Warm (not scalding) compresses 4-6 times a day. Continue this for 4 weeks. If still present will need referral to ophthalmology for management.

## 2018-11-18 NOTE — Assessment & Plan Note (Addendum)
His adherence history with Odefsey sounds terrible. Will switch him to [facility] for higher barrier to resistance. Will check VL with genotype today. He has never been on an integrase inhibitor before. Counseled on side effects and proper use. Safe sex counseling discussed and need to use strict condom use as he is infectious to other partners.  Return in 6 weeks to follow up on adherence and repeat viral load.  He received his flu vaccine today.

## 2018-11-18 NOTE — Assessment & Plan Note (Signed)
Uncontrolled and not taking amlodipine for no particular reason. Discussed progression of this condition to include high risk for heart attack, stroke, kidney damage, eye damage, sexual dysfunction. Considering his palpitations will change him to atenolol 50 mg daily. He will return in 6 weeks for evaluation of effect and titration if needed. Encouraged to stop smoking cigarettes as well.

## 2018-11-23 ENCOUNTER — Other Ambulatory Visit: Payer: Self-pay | Admitting: Infectious Diseases

## 2018-11-23 ENCOUNTER — Encounter: Payer: Self-pay | Admitting: Infectious Diseases

## 2018-11-23 DIAGNOSIS — F411 Generalized anxiety disorder: Secondary | ICD-10-CM

## 2018-11-23 MED ORDER — HYDROXYZINE PAMOATE 25 MG PO CAPS: 25.0000 mg | ORAL_CAPSULE | Freq: Three times a day (TID) | ORAL | 2 refills | Status: DC | PRN

## 2018-12-22 ENCOUNTER — Ambulatory Visit: Payer: Self-pay | Admitting: Infectious Diseases

## 2018-12-23 ENCOUNTER — Telehealth: Payer: Self-pay

## 2018-12-23 NOTE — Telephone Encounter (Signed)
Attempted to contact patient to reschedule appointment. Left message to call back  

## 2019-01-17 ENCOUNTER — Ambulatory Visit (INDEPENDENT_AMBULATORY_CARE_PROVIDER_SITE_OTHER): Payer: Self-pay | Admitting: Infectious Diseases

## 2019-01-17 ENCOUNTER — Encounter: Payer: Self-pay | Admitting: Infectious Diseases

## 2019-01-17 ENCOUNTER — Other Ambulatory Visit: Payer: Self-pay

## 2019-01-17 VITALS — BP 143/89 | HR 83 | Temp 98.1°F | Wt 186.0 lb

## 2019-01-17 DIAGNOSIS — R52 Pain, unspecified: Secondary | ICD-10-CM | POA: Insufficient documentation

## 2019-01-17 DIAGNOSIS — B20 Human immunodeficiency virus [HIV] disease: Secondary | ICD-10-CM

## 2019-01-17 DIAGNOSIS — I10 Essential (primary) hypertension: Secondary | ICD-10-CM

## 2019-01-17 DIAGNOSIS — M79602 Pain in left arm: Secondary | ICD-10-CM

## 2019-01-17 MED ORDER — GABAPENTIN 100 MG PO CAPS: 100.0000 mg | ORAL_CAPSULE | Freq: Three times a day (TID) | ORAL | 1 refills | Status: DC

## 2019-01-17 NOTE — Assessment & Plan Note (Signed)
Blood pressures just barely out of control today.  I encouraged him to continue trying to get his medication in daily to assess effect.  Suggested to take the medication in the morning, although I am not sure his increased urination is due to his atenolol; suspect it is more of a lifestyle factor.

## 2019-01-17 NOTE — Assessment & Plan Note (Signed)
Tingling shooting pain down the back of the arm that originates from his left shoulder.  He has free range of motion of all joints including his neck.  He has no neck pain or history of neck injury.  I suspect possible nerve compression/entrapment.  We will begin low-dose gabapentin to see if this helps his symptoms.  We will send him to sports medicine for evaluation.  He may need neurology referral.

## 2019-01-17 NOTE — Patient Instructions (Addendum)
For your arm pain - will try you on some gabapentin. Start 1 time a day and can increase to 3 times a day if you find this helpful and that it does not make you too sleepy.  I am going to arrange for you to be seen at the [facility] to evaluate why this may be happening.   Please continue taking your Biktarvy every day as you are doing now.   For your blood pressure try taking the blood pressure pill (Atenolol) when you are awake. It does not usually cause people to make more urine but incase it is contributing hopefully this will allow you some uninterrupted sleep.   Please call the [facility] to see if they can mail your Biktarvy, Gabapentin and Atenolol to you [phone].    Please return in 4 months - would like to check labs 2 weeks before if you can.

## 2019-01-17 NOTE — Assessment & Plan Note (Signed)
Viral load recently was undetectable.  We reviewed his historical labs.  I am glad that he has been taking his medications every day and they seem to be working.  We discussed how important it is to stay in care regularly so we can make certain his medication continues to work well for him. He is already had his flu shot. STI testing declined.  Excepted condoms.  Will offer again at upcoming appointment.  Otherwise he will need to go to the health department for testing and treatment.

## 2019-01-17 NOTE — Progress Notes (Signed)
Name: Clayton Erickson  DOB: 07/24/1980 MRN: [id number] PCP: Patient, No Pcp Per    Patient Active Problem List   Diagnosis Date Noted  . Human immunodeficiency virus (HIV) disease (HCC) 03/10/2006    Priority: High  . Tingling pain 01/17/2019  . Low vitamin D level 12/15/2017  . Hypertension 08/10/2008  . Anxiety state 11/18/2007  . History of syphilis 10/28/2007     Brief Narrative:  Clayton Erickson is a 38 y.o. man with HIV infection that was originally diagnosed in 2002. His CD4 nadir is "above 200". History of OIs: none.  HIV Risk: MSM.  [facility] [place] N#[phone]  Previous Regimens: . Complera 2016 >> suppressed  . Odefsey 2018 (poor adherence) . Biktarvy 11-2018   Genotypes: . 10-2017 >> no RT, PI or INSTI resistance   Subjective:  CC:  Follow up for HIV care.  Pain in the left shoulder that is numb and tingling persists.   HPI:  No states that he is doing pretty good since her last office visit 2 months ago.  He has been taking his Biktarvy every day and "knows it is working very well."  He saw his labs were undetectable recently and is thankful to see that.  He does not think he has had any interrupted doses.  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   His only concern today is the ongoing intermittent daily numbness and tingling down left arm.  He notices this most when he is playing videogames looking up at a TV screen from a seated position.  Has had no injury that he can recall to his neck or shoulder.  He said he saw a commercial that something like this may be a side effect from his previous Complera use.  He is not certain of the details but feels like he saw a commercial about this recently.  No injury to his knowledge.  He is never had any injury to the cervical spine.  No popping or grinding in the shoulder joint.  He was once able to shake it out but now he has persistent numbness and  tingling.  No perceived weakness.  His blood pressure is elevated today and tells me he is only taking his blood pressure medication every 2 to 3 days.  He states it makes him pee all night.   Review of Systems  Constitutional: Negative for chills, fever, malaise/fatigue and weight loss.  HENT: Negative for sore throat.        No dental problems  Respiratory: Negative for cough and sputum production.   Cardiovascular: Negative for chest pain and leg swelling.  Gastrointestinal: Negative for abdominal pain, diarrhea and vomiting.  Genitourinary: Negative for dysuria and flank pain.  Musculoskeletal: Positive for joint pain. Negative for myalgias and neck pain.  Skin: Negative for rash.  Neurological: Positive for tingling. Negative for dizziness, focal weakness and headaches.  Psychiatric/Behavioral: Negative for depression and substance abuse. The patient is not nervous/anxious and does not have insomnia.     Past Medical History:  Diagnosis Date  . Boils   . HIV infection (HCC)   . Hypertension   . Low vitamin D level     Outpatient Medications Prior to Visit  Medication Sig Dispense Refill  . atenolol (TENORMIN) 50 MG tablet Take 1 tablet (50 mg total) by mouth daily. 30 tablet 5  . bictegravir-emtricitabine-tenofovir AF (BIKTARVY) 50-200-25 MG TABS tablet Take 1  tablet by mouth daily. 30 tablet 5  . hydrOXYzine (VISTARIL) 25 MG capsule Take 1 capsule (25 mg total) by mouth 3 (three) times daily as needed. 60 capsule 2   No facility-administered medications prior to visit.     No Known Allergies   Social History   Substance and Sexual Activity  Sexual Activity Yes  . Partners: Male   Comment: declined condoms    Objective:   Vitals:   01/17/19 1453  BP: (!) 143/89  Pulse: 83  Temp: 98.1 F (36.7 C)  TempSrc: Oral  Weight: 186 lb (84.4 kg)   Body mass index is 25.94 kg/m.  Physical Exam Vitals signs reviewed.  Constitutional:      Appearance: He is  well-developed.     Comments: Seated comfortably in chair during visit.   HENT:     Mouth/Throat:     Dentition: Normal dentition. No dental abscesses.  Cardiovascular:     Rate and Rhythm: Normal rate and regular rhythm.     Heart sounds: Normal heart sounds.  Pulmonary:     Effort: Pulmonary effort is normal.     Breath sounds: Normal breath sounds.  Abdominal:     General: There is no distension.     Palpations: Abdomen is soft.     Tenderness: There is no abdominal tenderness.  Musculoskeletal:     Comments: Full range of motion of bilateral shoulder joints.  Full range of motion of his neck.  There is no weakness on exam today.  Unable to meaningfully assess reflexes without equipment.  Lymphadenopathy:     Cervical: No cervical adenopathy.  Skin:    General: Skin is warm and dry.     Capillary Refill: Capillary refill takes less than 2 seconds.     Findings: No rash.  Neurological:     Mental Status: He is alert and oriented to person, place, and time.  Psychiatric:        Mood and Affect: Mood normal.        Behavior: Behavior normal.        Thought Content: Thought content normal.        Judgment: Judgment normal.     Comments: In good spirits today and engaged in care discussion.       Lab Results Lab Results  Component Value Date   WBC 13.3 (H) 11/15/2018   HGB 14.2 11/15/2018   HCT 42.2 11/15/2018   MCV 84.9 11/15/2018   PLT 115 (L) 11/15/2018    Lab Results  Component Value Date   CREATININE 1.07 11/15/2018   BUN 12 11/15/2018   NA 134 (L) 11/15/2018   K 3.9 11/15/2018   CL 97 (L) 11/15/2018   CO2 27 11/15/2018    Lab Results  Component Value Date   ALT 13 11/15/2018   AST 18 11/15/2018   ALKPHOS 55 09/06/2014   BILITOT 0.9 11/15/2018    Lab Results  Component Value Date   CHOL 175 09/06/2014   HDL 88 09/06/2014   LDLCALC 74 09/06/2014   TRIG 66 09/06/2014   CHOLHDL 2.0 09/06/2014   HIV 1 RNA Quant (copies/mL)  Date Value  11/15/2018  <20 NOT DETECTED  07/07/2018 3,100 (H)  11/18/2017 3,340 (H)   CD4 T Cell Abs (/uL)  Date Value  07/07/2018 871  11/18/2017 790  09/06/2014 1,090     Assessment & Plan:   Problem List Items Addressed This Visit      High   Human immunodeficiency  virus (HIV) disease (HCC) (Chronic)    Viral load recently was undetectable.  We reviewed his historical labs.  I am glad that he has been taking his medications every day and they seem to be working.  We discussed how important it is to stay in care regularly so we can make certain his medication continues to work well for him. He is already had his flu shot. STI testing declined.  Excepted condoms.  Will offer again at upcoming appointment.  Otherwise he will need to go to the health department for testing and treatment.         Unprioritized   Tingling pain    Tingling shooting pain down the back of the arm that originates from his left shoulder.  He has free range of motion of all joints including his neck.  He has no neck pain or history of neck injury.  I suspect possible nerve compression/entrapment.  We will begin low-dose gabapentin to see if this helps his symptoms.  We will send him to sports medicine for evaluation.  He may need neurology referral.      Hypertension    Blood pressures just barely out of control today.  I encouraged him to continue trying to get his medication in daily to assess effect.  Suggested to take the medication in the morning, although I am not sure his increased urination is due to his atenolol; suspect it is more of a lifestyle factor.       Other Visit Diagnoses    Arm pain, left    -  Primary   Relevant Orders   AMB referral to sports medicine   HIV disease (HCC)       Relevant Orders   HIV 1 RNA quant-no reflex-bld   T-helper cell (CD4)- ([facility] only)     Return in about 4 months (around 05/17/2019).    [name], MSN, NP-C [facility] for Infectious Disease [facility] Pager: [phone] Office: [phone]  01/17/19  4:57 PM

## 2019-01-31 DIAGNOSIS — E559 Vitamin D deficiency, unspecified: Secondary | ICD-10-CM

## 2019-01-31 DIAGNOSIS — R7989 Other specified abnormal findings of blood chemistry: Secondary | ICD-10-CM

## 2019-01-31 HISTORY — DX: Other specified abnormal findings of blood chemistry: R79.89

## 2019-01-31 HISTORY — DX: Vitamin D deficiency, unspecified: E55.9

## 2019-02-09 ENCOUNTER — Telehealth: Payer: Self-pay | Admitting: *Deleted

## 2019-02-09 NOTE — Telephone Encounter (Signed)
Sounds good - thank you for the update. Hopefully he can get a faster appointment at another site.

## 2019-02-09 NOTE — Telephone Encounter (Signed)
Patient called. He is unable to be seen at [facility] for his shoulder pain as he is uninsured.  He called [facility], cannot be seen there until March.  RN gave him the phone number to Internal Medicine and to the [facility] to see if he can schedule there. Landis Gandy, RN

## 2019-02-10 ENCOUNTER — Other Ambulatory Visit: Payer: Self-pay

## 2019-02-10 ENCOUNTER — Ambulatory Visit (INDEPENDENT_AMBULATORY_CARE_PROVIDER_SITE_OTHER): Payer: Self-pay | Admitting: Family Medicine

## 2019-02-10 ENCOUNTER — Ambulatory Visit (HOSPITAL_COMMUNITY)
Admission: RE | Admit: 2019-02-10 | Discharge: 2019-02-10 | Disposition: A | Discharge status: Home / Self Care | Payer: Self-pay | Source: Ambulatory Visit | Attending: Family Medicine | Admitting: Family Medicine

## 2019-02-10 ENCOUNTER — Encounter: Payer: Self-pay | Admitting: Family Medicine

## 2019-02-10 VITALS — BP 124/71 | HR 76 | Temp 97.9°F | Ht 71.0 in | Wt 188.6 lb

## 2019-02-10 DIAGNOSIS — R200 Anesthesia of skin: Secondary | ICD-10-CM

## 2019-02-10 DIAGNOSIS — B20 Human immunodeficiency virus [HIV] disease: Secondary | ICD-10-CM

## 2019-02-10 DIAGNOSIS — Z0000 Encounter for general adult medical examination without abnormal findings: Secondary | ICD-10-CM

## 2019-02-10 DIAGNOSIS — M25561 Pain in right knee: Secondary | ICD-10-CM

## 2019-02-10 DIAGNOSIS — Z09 Encounter for follow-up examination after completed treatment for conditions other than malignant neoplasm: Secondary | ICD-10-CM

## 2019-02-10 DIAGNOSIS — Z7689 Persons encountering health services in other specified circumstances: Secondary | ICD-10-CM

## 2019-02-10 DIAGNOSIS — I10 Essential (primary) hypertension: Secondary | ICD-10-CM

## 2019-02-10 DIAGNOSIS — M25461 Effusion, right knee: Secondary | ICD-10-CM

## 2019-02-10 DIAGNOSIS — M79602 Pain in left arm: Secondary | ICD-10-CM

## 2019-02-10 LAB — POCT URINALYSIS DIPSTICK
Bilirubin, UA: NEGATIVE
Blood, UA: NEGATIVE
Glucose, UA: NEGATIVE
Ketones, UA: NEGATIVE
Leukocytes, UA: NEGATIVE
Nitrite, UA: NEGATIVE
Protein, UA: NEGATIVE
Spec Grav, UA: 1.01 (ref 1.010–1.025)
Urobilinogen, UA: 1 E.U./dL
pH, UA: 6.5 (ref 5.0–8.0)

## 2019-02-10 LAB — POCT GLYCOSYLATED HEMOGLOBIN (HGB A1C): Hemoglobin A1C: 4.7 % (ref 4.0–5.6)

## 2019-02-10 LAB — GLUCOSE, POCT (MANUAL RESULT ENTRY): POC Glucose: 108 mg/dl — AB (ref 70–99)

## 2019-02-10 MED ORDER — PREDNISONE 10 MG PO TABS: ORAL_TABLET | ORAL | 0 refills | Status: DC

## 2019-02-10 NOTE — Progress Notes (Signed)
Patient [facility]   New Patient--Establish Care  Subjective:  Patient ID: Clayton Erickson, male    DOB: 06/30/1980  Age: 38 y.o. MRN: [id number]  CC:  Chief Complaint  Patient presents with  . Pain     On set 6 or more mths ago- Left arm, both hands and finger tips, on/off since starting Complera Tab  . Joint Swelling    Right knee for three weeks    HPI Clayton Erickson is a 38 year old male who presents to [facility] today.    Past Medical History:  Diagnosis Date  . Boils   . HIV infection ([place])   . Hypertension   . Low vitamin D level     Current Status: This will be Mr. Clayton Erickson's initial office visit with me. He was previously not seeing a PCP for his needs. Since his last office visit, he is doing well with no complaints. He has c/o swollen and painful right knee X 1 month. He has c/o numbness and pain in left upper extremity, which he r/t side effects of anti-viral Complera. He discontinued this medication X 6 months ago, and has since began taking Biktarvy. He began new medication 2 months. He continues to follow up with Infection Disease as needed. He denies visual changes, chest pain, cough, shortness of breath, heart palpitations, and falls. He has occasional headaches and dizziness with position changes. Denies severe headaches, confusion, seizures, double vision, and blurred vision, nausea and vomiting. He denies fevers, chills, recent infections, weight loss, and night sweats. No reports of GI problems such as diarrhea, and constipation. He has no reports of blood in stools, dysuria and hematuria. His anxiety is stable today. He denies suicidal ideations, homicidal ideations, or auditory hallucinations.   Past Surgical History:  Procedure Laterality Date  . I&D EXTREMITY      Family History  Problem Relation Age of Onset  . Hypertension Mother     Social History   Socioeconomic History  . Marital status: Single   Spouse name: Not on file  . Number of children: Not on file  . Years of education: Not on file  . Highest education level: Not on file  Occupational History  . Not on file  Tobacco Use  . Smoking status: Former Smoker    Types: Cigarettes  . Smokeless tobacco: Never Used  Substance and Sexual Activity  . Alcohol use: Yes  . Drug use: Not Currently    Frequency: 30.0 times per week    Types: Marijuana    Comment: stopped a month a a half ago  . Sexual activity: Yes    Partners: Male    Comment: declined condoms  Other Topics Concern  . Not on file  Social History Narrative  . Not on file   Social Determinants of Health   Financial Resource Strain:   . Difficulty of Paying Living Expenses: Not on file  Food Insecurity:   . Worried About Charity fundraiser in the Last Year: Not on file  . Ran Out of Food in the Last Year: Not on file  Transportation Needs:   . Lack of Transportation (Medical): Not on file  . Lack of Transportation (Non-Medical): Not on file  Physical Activity:   . Days of Exercise per Week: Not on file  . Minutes of Exercise per Session: Not on file  Stress:   . Feeling of Stress : Not on file  Social Connections:   .  Frequency of Communication with Friends and Family: Not on file  . Frequency of Social Gatherings with Friends and Family: Not on file  . Attends Religious Services: Not on file  . Active Member of Clubs or Organizations: Not on file  . Attends Banker Meetings: Not on file  . Marital Status: Not on file  Intimate Partner Violence:   . Fear of Current or Ex-Partner: Not on file  . Emotionally Abused: Not on file  . Physically Abused: Not on file  . Sexually Abused: Not on file    Outpatient Medications Prior to Visit  Medication Sig Dispense Refill  . atenolol (TENORMIN) 50 MG tablet Take 1 tablet (50 mg total) by mouth daily. 30 tablet 5  . bictegravir-emtricitabine-tenofovir AF (BIKTARVY) 50-200-25 MG TABS tablet Take  1 tablet by mouth daily. 30 tablet 5  . gabapentin (NEURONTIN) 100 MG capsule Take 1 capsule (100 mg total) by mouth 3 (three) times daily. 90 capsule 1  . hydrOXYzine (VISTARIL) 25 MG capsule Take 1 capsule (25 mg total) by mouth 3 (three) times daily as needed. 60 capsule 2   No facility-administered medications prior to visit.    No Known Allergies  ROS Review of Systems  Constitutional: Negative.   HENT: Negative.   Eyes: Negative.   Respiratory: Negative.   Cardiovascular: Negative.   Gastrointestinal: Negative.   Endocrine: Negative.   Genitourinary: Negative.   Musculoskeletal: Positive for arthralgias (generalized pain).  Skin: Negative.   Allergic/Immunologic: Negative.   Neurological: Negative.   Hematological: Negative.   Psychiatric/Behavioral: Negative.       Objective:    Physical Exam  Constitutional: He is oriented to person, place, and time. He appears well-developed and well-nourished.  HENT:  Head: Normocephalic and atraumatic.  Eyes: Conjunctivae are normal.  Cardiovascular: Normal rate, regular rhythm, normal heart sounds and intact distal pulses.  Pulmonary/Chest: Effort normal and breath sounds normal.  Abdominal: Soft. Bowel sounds are normal.  Musculoskeletal:        General: Normal range of motion.     Cervical back: Normal range of motion and neck supple.  Neurological: He is alert and oriented to person, place, and time. He has normal reflexes.  Skin: Skin is warm and dry.  Psychiatric: He has a normal mood and affect. His behavior is normal. Judgment and thought content normal.  Nursing note and vitals reviewed.   BP 124/71   Pulse 76   Temp 97.9 F (36.6 C) (Oral)   Ht 5\' 11"  (1.803 m)   Wt 188 lb 9.6 oz (85.5 kg)   SpO2 95%   BMI 26.30 kg/m  Wt Readings from Last 3 Encounters:  02/10/19 188 lb 9.6 oz (85.5 kg)  01/17/19 186 lb (84.4 kg)  02/14/18 173 lb (78.5 kg)     There are no preventive care reminders to display for  this patient.  There are no preventive care reminders to display for this patient.  Lab Results  Component Value Date   TSH 0.378 (L) 02/10/2019   Lab Results  Component Value Date   WBC 13.3 (H) 11/15/2018   HGB 14.2 11/15/2018   HCT 42.2 11/15/2018   MCV 84.9 11/15/2018   PLT 115 (L) 11/15/2018   Lab Results  Component Value Date   NA 134 (L) 11/15/2018   K 3.9 11/15/2018   CO2 27 11/15/2018   GLUCOSE 83 11/15/2018   BUN 12 11/15/2018   CREATININE 1.07 11/15/2018   BILITOT 0.9 11/15/2018  ALKPHOS 55 09/06/2014   AST 18 11/15/2018   ALT 13 11/15/2018   PROT 8.1 11/15/2018   ALBUMIN 4.5 09/06/2014   CALCIUM 10.2 11/15/2018   Lab Results  Component Value Date   CHOL 173 02/10/2019   Lab Results  Component Value Date   HDL 92 02/10/2019   Lab Results  Component Value Date   LDLCALC 71 02/10/2019   Lab Results  Component Value Date   TRIG 49 02/10/2019   Lab Results  Component Value Date   CHOLHDL 1.9 02/10/2019   Lab Results  Component Value Date   HGBA1C 4.7 02/10/2019      Assessment & Plan:   1. Encounter to establish care  2. Human immunodeficiency virus (HIV) disease (HCC) Continue to follow up with Infection Disease as needed.   3. Essential hypertension The current medical regimen is effective; blood pressure is stable at 124/71 today; continue present plan and medications as prescribed. She will continue to take medications as prescribed, to decrease high sodium intake, excessive alcohol intake, increase potassium intake, smoking cessation, and increase physical activity of at least 30 minutes of cardio activity daily. She will continue to follow Heart Healthy or DASH diet.  4. Pain and numbness of left upper extremity  5. Right knee pain, unspecified chronicity - DG Knee Complete 4 Views Right; Future  6. Pain and swelling of right knee We will initiate Prednisone taper today.  - predniSONE (DELTASONE) 10 MG tablet; Day #1: Take 6  tablets by mouth Day #2: Take 5 tablets by mouth Day #3: Take 4 tablets by mouth Day #4: Take 3 tablets by mouth Day #5: Take 2 tablets by mouth Day #6: Take 1 tablet, then complete.  Dispense: 21 tablet; Refill: 0  7. Healthcare maintenance - POCT HgB A1C - Urinalysis Dipstick - Glucose (CBG) - TSH - Lipid Panel - Vitamin B12 - Vitamin D, 25-hydroxy  8. Follow up He will follow up in 2 months.   Meds ordered this encounter  Medications  . predniSONE (DELTASONE) 10 MG tablet    Sig: Day #1: Take 6 tablets by mouth Day #2: Take 5 tablets by mouth Day #3: Take 4 tablets by mouth Day #4: Take 3 tablets by mouth Day #5: Take 2 tablets by mouth Day #6: Take 1 tablet, then complete.    Dispense:  21 tablet    Refill:  0    Orders Placed This Encounter  Procedures  . DG Knee Complete 4 Views Right  . TSH  . Lipid Panel  . Vitamin B12  . Vitamin D, 25-hydroxy  . POCT HgB A1C  . Urinalysis Dipstick  . Glucose (CBG)    Referral Orders  No referral(s) requested today    Raliegh Ip,  MSN, FNP-BC [facility] [address] [place], [place] [address] [phone]- fax     Problem List Items Addressed This Visit      Cardiovascular and Mediastinum   Hypertension     Other   Human immunodeficiency virus (HIV) disease (HCC) (Chronic)    Other Visit Diagnoses    Encounter to establish care    -  Primary   Pain and numbness of left upper extremity       Right knee pain, unspecified chronicity       Relevant Orders   DG Knee Complete 4 Views Right (Completed)   Pain and swelling of right knee  Relevant Medications   predniSONE (DELTASONE) 10 MG tablet   Healthcare maintenance       Relevant Orders   POCT HgB A1C (Completed)   Urinalysis Dipstick (Completed)   Glucose (CBG) (Completed)   TSH (Completed)   Lipid Panel (Completed)   Vitamin B12 (Completed)   Vitamin D,  25-hydroxy (Completed)   Follow up          Meds ordered this encounter  Medications  . predniSONE (DELTASONE) 10 MG tablet    Sig: Day #1: Take 6 tablets by mouth Day #2: Take 5 tablets by mouth Day #3: Take 4 tablets by mouth Day #4: Take 3 tablets by mouth Day #5: Take 2 tablets by mouth Day #6: Take 1 tablet, then complete.    Dispense:  21 tablet    Refill:  0    Follow-up: Return in about 2 months (around 04/13/2019).    Kallie LocksNatalie M Chanequa Spees, FNP

## 2019-02-11 LAB — VITAMIN D 25 HYDROXY (VIT D DEFICIENCY, FRACTURES): Vit D, 25-Hydroxy: 6.9 ng/mL — ABNORMAL LOW (ref 30.0–100.0)

## 2019-02-11 LAB — LIPID PANEL
Chol/HDL Ratio: 1.9 ratio (ref 0.0–5.0)
Cholesterol, Total: 173 mg/dL (ref 100–199)
HDL: 92 mg/dL (ref >39)
LDL Chol Calc (NIH): 71 mg/dL (ref 0–99)
Triglycerides: 49 mg/dL (ref 0–149)
VLDL Cholesterol Cal: 10 mg/dL (ref 5–40)

## 2019-02-11 LAB — TSH: TSH: 0.378 u[IU]/mL — ABNORMAL LOW (ref 0.450–4.500)

## 2019-02-11 LAB — VITAMIN B12: Vitamin B-12: 405 pg/mL (ref 232–1245)

## 2019-02-12 DIAGNOSIS — M25561 Pain in right knee: Secondary | ICD-10-CM | POA: Insufficient documentation

## 2019-02-12 DIAGNOSIS — M25562 Pain in left knee: Secondary | ICD-10-CM | POA: Insufficient documentation

## 2019-02-14 ENCOUNTER — Ambulatory Visit: Payer: Self-pay | Admitting: Family

## 2019-02-27 ENCOUNTER — Other Ambulatory Visit: Payer: Self-pay | Admitting: Family Medicine

## 2019-02-27 ENCOUNTER — Encounter: Payer: Self-pay | Admitting: Family Medicine

## 2019-02-27 DIAGNOSIS — M25461 Effusion, right knee: Secondary | ICD-10-CM

## 2019-02-27 DIAGNOSIS — M25561 Pain in right knee: Secondary | ICD-10-CM

## 2019-02-27 DIAGNOSIS — E559 Vitamin D deficiency, unspecified: Secondary | ICD-10-CM

## 2019-02-27 DIAGNOSIS — R7989 Other specified abnormal findings of blood chemistry: Secondary | ICD-10-CM

## 2019-02-27 MED ORDER — VITAMIN D (ERGOCALCIFEROL) 1.25 MG (50000 UNIT) PO CAPS: 50000.0000 [IU] | ORAL_CAPSULE | ORAL | 6 refills | Status: DC

## 2019-03-06 ENCOUNTER — Other Ambulatory Visit: Payer: Self-pay | Admitting: Family Medicine

## 2019-03-07 ENCOUNTER — Ambulatory Visit (HOSPITAL_COMMUNITY)
Admission: RE | Admit: 2019-03-07 | Discharge: 2019-03-07 | Disposition: A | Discharge status: Home / Self Care | Payer: Self-pay | Source: Ambulatory Visit | Attending: Family Medicine | Admitting: Family Medicine

## 2019-03-07 ENCOUNTER — Other Ambulatory Visit: Payer: Self-pay

## 2019-03-07 DIAGNOSIS — M25561 Pain in right knee: Secondary | ICD-10-CM | POA: Insufficient documentation

## 2019-03-07 DIAGNOSIS — M25461 Effusion, right knee: Secondary | ICD-10-CM | POA: Insufficient documentation

## 2019-03-14 ENCOUNTER — Telehealth: Payer: Self-pay | Admitting: Family Medicine

## 2019-03-15 ENCOUNTER — Other Ambulatory Visit: Payer: Self-pay | Admitting: Family Medicine

## 2019-03-15 DIAGNOSIS — M25461 Effusion, right knee: Secondary | ICD-10-CM

## 2019-03-15 MED ORDER — ACETAMINOPHEN 500 MG PO TABS: 500.0000 mg | ORAL_TABLET | Freq: Two times a day (BID) | ORAL | 3 refills | Status: DC | PRN

## 2019-03-15 MED ORDER — PREDNISONE 10 MG PO TABS: ORAL_TABLET | ORAL | 0 refills | Status: DC

## 2019-03-15 NOTE — Telephone Encounter (Signed)
done

## 2019-03-15 NOTE — Progress Notes (Signed)
Results of CT scan of right knee reviewed today. Patient continues to have increased right knee pain and swelling. He is currently wearing knee brace daily while working and taking OTC pain medications. Ibuprofen is not a consideration, because it interferes with his antiviral medication. We will refill Prednisone and send New Rx for Acetaminophen 500 mg BID. Order placed MRI for further evaluation, awaiting insurance approval and possible scheduling.

## 2019-03-16 ENCOUNTER — Telehealth: Payer: Self-pay

## 2019-03-16 NOTE — Telephone Encounter (Signed)
Message left for patient to call back with updated insurance information for referral.

## 2019-03-20 NOTE — Telephone Encounter (Signed)
Patient has a referral for a MRI of the right knee. Multiple attempts & messages left for patient to call back with proper insurance information. No call backs.

## 2019-04-10 ENCOUNTER — Telehealth: Payer: Self-pay

## 2019-04-10 ENCOUNTER — Other Ambulatory Visit: Payer: Self-pay

## 2019-04-10 MED ORDER — GABAPENTIN 100 MG PO CAPS: 100.0000 mg | ORAL_CAPSULE | Freq: Three times a day (TID) | ORAL | 2 refills | Status: DC

## 2019-04-10 NOTE — Telephone Encounter (Signed)
Yes please, OK to refill x 3 months. Will talk with him about transitioning rx to pcp at next OV

## 2019-04-10 NOTE — Telephone Encounter (Signed)
Received refill request from pharmacy for Gabapentin 100 mg caps. Patient is working with Internal Medicine regarding pain of RT knee. Will forward message to NP to advise if refill is okay.  Clayton Erickson, [place]

## 2019-04-14 ENCOUNTER — Ambulatory Visit: Payer: Self-pay | Admitting: Family Medicine

## 2019-05-11 NOTE — Addendum Note (Signed)
Addended by: Mariea Clonts D on: 05/11/2019 10:49 AM   Modules accepted: Orders

## 2019-05-17 ENCOUNTER — Other Ambulatory Visit: Payer: Self-pay

## 2019-06-05 ENCOUNTER — Telehealth: Payer: Self-pay | Admitting: *Deleted

## 2019-06-05 ENCOUNTER — Encounter: Payer: Self-pay | Admitting: Infectious Diseases

## 2019-06-05 NOTE — Telephone Encounter (Signed)
RN left message asking patient to reschedule today's missed visit. Tina Gruner M, RN    

## 2019-06-23 ENCOUNTER — Encounter: Payer: Self-pay | Admitting: Infectious Diseases

## 2019-07-05 ENCOUNTER — Encounter: Payer: Self-pay | Admitting: Infectious Diseases

## 2019-09-20 ENCOUNTER — Telehealth: Payer: Self-pay

## 2019-09-20 ENCOUNTER — Ambulatory Visit: Payer: Self-pay

## 2019-09-20 ENCOUNTER — Other Ambulatory Visit: Payer: Self-pay

## 2019-09-20 DIAGNOSIS — B20 Human immunodeficiency virus [HIV] disease: Secondary | ICD-10-CM

## 2019-09-20 NOTE — Telephone Encounter (Signed)
Patient in for labs and financial appointment and asked to speak with RN. Patient reports arm pain and tingling in his left arm. He attributes this to his old ARV regimen. Also reporting bilateral knee pain. Patient does not have a PCP (last seen by one 6 months ago). Scans done on his knees however he cannot afford further imaging due to lack of insurance. Told patient that Judeth Cornfield would likely need to place a referral to an outside provider or he should follow up with previous PCP. Provided patient with additional PCP information and reminded him of his follow up with Judeth Cornfield on 8/4 where his concerns can be more thoroughly addressed.   Arbutus Nelligan Loyola Mast, RN

## 2019-09-21 LAB — T-HELPER CELL (CD4) - (RCID CLINIC ONLY)
CD4 % Helper T Cell: 37 % (ref 33–65)
CD4 T Cell Abs: 1608 /uL (ref 400–1790)

## 2019-09-22 LAB — HIV-1 RNA QUANT-NO REFLEX-BLD
HIV 1 RNA Quant: <20 copies/mL
HIV-1 RNA Quant, Log: <1.3 Log copies/mL

## 2019-09-23 ENCOUNTER — Other Ambulatory Visit: Payer: Self-pay | Admitting: Infectious Diseases

## 2019-09-25 ENCOUNTER — Encounter: Payer: Self-pay | Admitting: Infectious Diseases

## 2019-10-04 ENCOUNTER — Encounter: Payer: Self-pay | Admitting: Infectious Diseases

## 2019-10-11 ENCOUNTER — Encounter (HOSPITAL_COMMUNITY): Payer: Self-pay

## 2019-10-11 ENCOUNTER — Emergency Department (HOSPITAL_COMMUNITY)
Admission: EM | Admit: 2019-10-11 | Discharge: 2019-10-11 | Disposition: A | Discharge status: Home / Self Care | Payer: Self-pay | Attending: Emergency Medicine | Admitting: Emergency Medicine

## 2019-10-11 ENCOUNTER — Other Ambulatory Visit: Payer: Self-pay

## 2019-10-11 DIAGNOSIS — I10 Essential (primary) hypertension: Secondary | ICD-10-CM | POA: Insufficient documentation

## 2019-10-11 DIAGNOSIS — Y999 Unspecified external cause status: Secondary | ICD-10-CM | POA: Insufficient documentation

## 2019-10-11 DIAGNOSIS — Y939 Activity, unspecified: Secondary | ICD-10-CM | POA: Insufficient documentation

## 2019-10-11 DIAGNOSIS — Z79899 Other long term (current) drug therapy: Secondary | ICD-10-CM | POA: Insufficient documentation

## 2019-10-11 DIAGNOSIS — S61411A Laceration without foreign body of right hand, initial encounter: Secondary | ICD-10-CM | POA: Insufficient documentation

## 2019-10-11 DIAGNOSIS — W268XXA Contact with other sharp object(s), not elsewhere classified, initial encounter: Secondary | ICD-10-CM | POA: Insufficient documentation

## 2019-10-11 DIAGNOSIS — Y929 Unspecified place or not applicable: Secondary | ICD-10-CM | POA: Insufficient documentation

## 2019-10-11 DIAGNOSIS — Z87891 Personal history of nicotine dependence: Secondary | ICD-10-CM | POA: Insufficient documentation

## 2019-10-11 MED ORDER — LIDOCAINE-EPINEPHRINE 2 %-1:100000 IJ SOLN
20.0000 mL | Freq: Once | INTRAMUSCULAR | Status: AC
  Administered 2019-10-11: 20 mL
  Filled 2019-10-11: qty 1

## 2019-10-11 NOTE — ED Triage Notes (Signed)
Patient arrived with a laceration to right palm from a dog leash. Bleeding controlled.

## 2019-10-11 NOTE — ED Provider Notes (Signed)
[facility]EMERGENCY DEPT Provider Note: Clayton Dell, MD, FACEP  CSN: [id number] MRN: [id number] ARRIVAL: 10/11/19 at 0519 ROOM: WA25/WA25   CHIEF COMPLAINT  Laceration   HISTORY OF PRESENT ILLNESS  10/11/19 6:09 AM Clayton Erickson is a 39 y.o. male  Who cut the palm of his right hand on a dog leash (which was new and clean) about an hour prior to arrival.  He has a laceration at about the MCP joint of the index finger.  He rates associated pain is a 9 out of 10, worse with movement..  He has no functional or sensory deficit associated.  His last tetanus shot was about 5 years ago.   Past Medical History:  Diagnosis Date  . Boils   . Decreased thyroid stimulating hormone (TSH) level 01/2019  . HIV infection (HCC)   . Hypertension   . Low vitamin D level   . Vitamin D deficiency 01/2019    Past Surgical History:  Procedure Laterality Date  . I & D EXTREMITY      Family History  Problem Relation Age of Onset  . Hypertension Mother     Social History   Tobacco Use  . Smoking status: Former Smoker    Types: Cigarettes  . Smokeless tobacco: Never Used  Vaping Use  . Vaping Use: Never assessed  Substance Use Topics  . Alcohol use: Yes  . Drug use: Not Currently    Frequency: 30.0 times per week    Types: Marijuana    Comment: stopped a month a a half ago    Prior to Admission medications   Medication Sig Start Date End Date Taking? Authorizing Provider  atenolol (TENORMIN) 50 MG tablet Take 1 tablet (50 mg total) by mouth daily. 11/15/18   Blanchard Kelch, NP  BIKTARVY 50-200-25 MG TABS tablet TAKE 1 TABLET BY MOUTH DAILY 09/25/19   Blanchard Kelch, NP  gabapentin (NEURONTIN) 100 MG capsule Take 1 capsule (100 mg total) by mouth 3 (three) times daily. 04/10/19   Blanchard Kelch, NP  Vitamin D, Ergocalciferol, (DRISDOL) 1.25 MG (50000 UT) CAPS capsule Take 1 capsule (50,000 Units total) by mouth every 7 (seven) days. 02/27/19   Kallie Locks, FNP    Emtricitab-Rilpivir-Tenofov DF (COMPLERA) 200-25-300 MG TABS TAKE 1 TABLET BY MOUTH DAILY WITH A 400 CALORIE MEAL Patient not taking: Reported on 12/13/2017 12/04/14 12/13/17  Daiva Eves, Lisette Grinder, MD    Allergies Patient has no known allergies.   REVIEW OF SYSTEMS  Negative except as noted here or in the History of Present Illness.   PHYSICAL EXAMINATION  Initial Vital Signs Blood pressure (!) 152/120, pulse 74, temperature 97.8 F (36.6 C), temperature source Oral, resp. rate 18, SpO2 94 %.  Examination General: Well-developed, well-nourished male in no acute distress; appearance consistent with age of record HENT: normocephalic; atraumatic Eyes: Normal appearance Neck: supple Heart: regular rate and rhythm Lungs: clear to auscultation bilaterally Abdomen: soft; nondistended; nontender; bowel sounds present Extremities: No deformity; full range of motion; laceration of vulvar aspect of right second MCP joint, index finger distally neurovascularly intact with intact tendon function, old burn scar present:   Neurologic: Awake, alert and oriented; motor function intact in all extremities and symmetric; no facial droop Skin: Warm and dry Psychiatric: Normal mood and affect   RESULTS  Summary of this visit's results, reviewed and interpreted by myself:   EKG Interpretation  Date/Time:    Ventricular Rate:    PR Interval:  QRS Duration:   QT Interval:    QTC Calculation:   R Axis:     Text Interpretation:        Laboratory Studies: No results found for this or any previous visit (from the past 24 hour(s)). Imaging Studies: No results found.  ED COURSE and MDM  Nursing notes, initial and subsequent vitals signs, including pulse oximetry, reviewed and interpreted by myself.  Vitals:   10/11/19 0604  BP: (!) 152/120  Pulse: 74  Resp: 18  Temp: 97.8 F (36.6 C)  TempSrc: Oral  SpO2: 94%   Medications  lidocaine-EPINEPHrine (XYLOCAINE W/EPI) 2 %-1:100000  (with pres) injection 20 mL (has no administration in time range)    No evidence of tendon or neurovascular injury on exam.  Wound closed as described below.  The wound is clean and was caused by a new, clean leash.  I do not believe antibiotics are indicated at this time.  The patient is HIV positive but he has an undetectable viral load on Biktarvy.  PROCEDURES  Procedures  LACERATION REPAIR Performed by: Carlisle Beers Delia Sitar Authorized by: Carlisle Beers Daeton Kluth Consent: Verbal consent obtained. Risks and benefits: risks, benefits and alternatives were discussed Consent given by: patient Patient identity confirmed: provided demographic data Prepped and Draped in normal sterile fashion Wound explored  Laceration Location: Palm of right hand  Laceration Length: 2 cm  No Foreign Bodies seen or palpated  Anesthesia: local infiltration  Local anesthetic: lidocaine 2% with epinephrine  Anesthetic total: 3 ml  Irrigation method: syringe Amount of cleaning: standard  Skin closure: 4-0 Prolene  Number of sutures: 5  Technique: Simple interrupted  Patient tolerance: Patient tolerated the procedure well with no immediate complications.    ED DIAGNOSES     ICD-10-CM   1. Laceration of right hand without foreign body, initial encounter  S61.411A        Paula Libra, MD 10/11/19 [phone]

## 2019-10-20 ENCOUNTER — Other Ambulatory Visit: Payer: Self-pay | Admitting: Infectious Diseases

## 2019-10-26 ENCOUNTER — Ambulatory Visit (HOSPITAL_COMMUNITY)
Admission: EM | Admit: 2019-10-26 | Discharge: 2019-10-26 | Disposition: A | Discharge status: Home / Self Care | Payer: Self-pay

## 2019-10-26 ENCOUNTER — Other Ambulatory Visit: Payer: Self-pay

## 2019-10-26 DIAGNOSIS — Z4802 Encounter for removal of sutures: Secondary | ICD-10-CM

## 2019-10-26 NOTE — ED Triage Notes (Signed)
Suture removal of 5 simple interrupted. Scant amount of purulence observed at medial suture area. Per Dr. Delton See, wound healing well, keep it clean and apply triple abx to wound.

## 2019-11-22 ENCOUNTER — Other Ambulatory Visit: Payer: Self-pay | Admitting: Infectious Diseases

## 2019-11-22 DIAGNOSIS — I10 Essential (primary) hypertension: Secondary | ICD-10-CM

## 2019-11-28 ENCOUNTER — Ambulatory Visit: Payer: Self-pay | Admitting: Infectious Diseases

## 2020-01-14 ENCOUNTER — Other Ambulatory Visit: Payer: Self-pay | Admitting: Infectious Diseases

## 2020-02-15 ENCOUNTER — Ambulatory Visit (INDEPENDENT_AMBULATORY_CARE_PROVIDER_SITE_OTHER): Payer: Self-pay | Admitting: Infectious Diseases

## 2020-02-15 ENCOUNTER — Other Ambulatory Visit: Payer: Self-pay | Admitting: Infectious Diseases

## 2020-02-15 ENCOUNTER — Encounter: Payer: Self-pay | Admitting: Infectious Diseases

## 2020-02-15 ENCOUNTER — Other Ambulatory Visit: Payer: Self-pay

## 2020-02-15 VITALS — Ht 71.0 in | Wt 203.0 lb

## 2020-02-15 DIAGNOSIS — L819 Disorder of pigmentation, unspecified: Secondary | ICD-10-CM

## 2020-02-15 DIAGNOSIS — Z8619 Personal history of other infectious and parasitic diseases: Secondary | ICD-10-CM

## 2020-02-15 DIAGNOSIS — I10 Essential (primary) hypertension: Secondary | ICD-10-CM

## 2020-02-15 DIAGNOSIS — B20 Human immunodeficiency virus [HIV] disease: Secondary | ICD-10-CM

## 2020-02-15 MED ORDER — ATENOLOL 50 MG PO TABS: 50.0000 mg | ORAL_TABLET | Freq: Every day | ORAL | 3 refills | Status: DC

## 2020-02-15 MED ORDER — BIKTARVY 50-200-25 MG PO TABS: 1.0000 | ORAL_TABLET | Freq: Every day | ORAL | 4 refills | Status: DC

## 2020-02-15 NOTE — Telephone Encounter (Signed)
Is scheduled for appointment today at 12/16. Refills pending

## 2020-02-15 NOTE — Patient Instructions (Addendum)
Nice to see you!  Please continue your Biktarvy once a day - your ADAP I still active and they have a new prescription on file for you.  Plan to come back for a financial visit in January 2022 to renew for the next 6 months.   Please call [facility] @ [phone] to schedule a dental appointment   Will place a referral for dermatology for you to evaluate the spot next to your eye.    CABENUVA is the new medication to treat HIV. It is an injection in each butt cheek every 30 days at this time.   We would start with a pill "lead in" period for you where you take 2 pills once a day with food for 28 days to make sure you tolerate them well before we do the long acting injection.   Largely the side effects were of course related to the injection itself, but during the studies where this was used to treat HIV patients there were only a very small (< 5) number of patients that stopped it due to pain from the shots. Most of the patients in the trials reported overall improvement in the pain associated with the shots over time.   Patient Information Link: https://gskpro.com/content/dam/global/hcpportal/en_US/Prescribing_Information/Cabenuva/pdf/CABENUVA-PI-PIL-[email or web address]-IFU3.PDF#page=36   Plan to come back again in 3 months so we can talk about Cabenuva again and see if this is a safe option for you.   I refilled your blood pressure medication - please start taking it once a day again and will see what your blood pressure looks like at your next visit.

## 2020-02-15 NOTE — Progress Notes (Signed)
Name: Clayton Erickson  DOB: 1981/01/10 MRN: [id number] PCP: Shayne Alken, MD    Brief Narrative:  Clayton Erickson is a 39 y.o. man with HIV infection that was originally diagnosed in 2002. His CD4 nadir is "above 200". History of OIs: none.  HIV Risk: MSM.  Previously in care at [facility], [place] [phone]  Previous Regimens: . Complera 2016 >> suppressed  . Odefsey 2018 (poor adherence) . Biktarvy 11-2018   Genotypes: . 10-2017 >> no RT, PI or INSTI resistance   Subjective:  CC:  Follow up for HIV care.   Changes in skin on left eye  Needs dental visit for routine care.    HPI:  [name] is here for follow up. Has been a while since we have seen him. He states he has been taking his biktarvy however since last visit. States he misses his medications 2-3 days in a row sometimes; overall very tired of taking his medication in general. Interested in cabenuva injections and wants more information.  In an new relationship with new male partner, also HIV+ on treatment and undetectable.   Has been working a lot. Feels like he has gained too much weight and wonders if it is due to [employer]. Does not eat much outside of routine meals. Sleeping well for the most part.   New changes to the skin near the left eye on his face. This started the week before thanksgiving as a bump that maybe was a little red. Started flaking and had a central "head" to it at one point. Put toothpaste on it to dry it out and since then it has decreased in size but is irregular and dark in color now.    Review of Systems  Constitutional: Negative for chills, fever, malaise/fatigue and weight loss.  HENT: Negative for sore throat.   Respiratory: Negative for cough, sputum production and shortness of breath.   Cardiovascular: Negative.   Gastrointestinal: Negative for abdominal pain, diarrhea and vomiting.  Musculoskeletal: Negative for joint pain, myalgias and neck pain.   Skin: Positive for rash.  Neurological: Negative for headaches.  Psychiatric/Behavioral: Negative for depression and substance abuse. The patient is not nervous/anxious.     Past Medical History:  Diagnosis Date  . Boils   . Decreased thyroid stimulating hormone (TSH) level 01/2019  . HIV infection (HCC)   . Hypertension   . Low vitamin D level   . Vitamin D deficiency 01/2019    Outpatient Medications Prior to Visit  Medication Sig Dispense Refill  . gabapentin (NEURONTIN) 100 MG capsule Take 1 capsule (100 mg total) by mouth 3 (three) times daily. (Patient not taking: Reported on 02/15/2020) 90 capsule 2  . Vitamin D, Ergocalciferol, (DRISDOL) 1.25 MG (50000 UT) CAPS capsule Take 1 capsule (50,000 Units total) by mouth every 7 (seven) days. (Patient not taking: Reported on 02/15/2020) 5 capsule 6  . atenolol (TENORMIN) 50 MG tablet TAKE 1 TABLET(50 MG) BY MOUTH DAILY (Patient not taking: Reported on 02/15/2020) 30 tablet 0  . BIKTARVY 50-200-25 MG TABS tablet TAKE 1 TABLET BY MOUTH DAILY 30 tablet 0   No facility-administered medications prior to visit.    No Known Allergies   Social History   Substance and Sexual Activity  Sexual Activity Yes  . Partners: Male   Comment: declined condoms    Objective:   Vitals:   02/15/20 1038  Weight: 203 lb (92.1 kg)  Height: 5\' 11"  (1.803 m)   Body mass index  is 28.31 kg/m.  Physical Exam Constitutional:      Appearance: Normal appearance. He is not ill-appearing.  HENT:     Head: Normocephalic.     Mouth/Throat:     Mouth: Mucous membranes are moist.     Pharynx: Oropharynx is clear.  Eyes:     General: No scleral icterus. Pulmonary:     Effort: Pulmonary effort is normal.  Musculoskeletal:        General: Normal range of motion.     Cervical back: Normal range of motion.  Skin:    Coloration: Skin is not jaundiced or pale.  Neurological:     Mental Status: He is alert and oriented to person, place, and time.   Psychiatric:        Mood and Affect: Mood normal.        Judgment: Judgment normal.    Left lateral canthus / temple. Multicolored hyperpigmented annular lesion about 1 cm in diameter. He showed me a photo from a few weeks ago and it was purple/red in color then with central white ulceration/comodone appearing spot.      Lab Results Lab Results  Component Value Date   WBC 9.8 02/15/2020   HGB 15.7 02/15/2020   HCT 46.0 02/15/2020   MCV 83.2 02/15/2020   PLT 258 02/15/2020    Lab Results  Component Value Date   CREATININE 0.96 02/15/2020   BUN 10 02/15/2020   NA 133 (L) 02/15/2020   K 4.4 02/15/2020   CL 99 02/15/2020   CO2 27 02/15/2020    Lab Results  Component Value Date   ALT 21 02/15/2020   AST 22 02/15/2020   ALKPHOS 55 09/06/2014   BILITOT 0.8 02/15/2020    Lab Results  Component Value Date   CHOL 173 02/10/2019   HDL 92 02/10/2019   LDLCALC 71 02/10/2019   TRIG 49 02/10/2019   CHOLHDL 1.9 02/10/2019   HIV 1 RNA Quant (copies/mL)  Date Value  02/15/2020 <20 DETECTED  09/20/2019 <20 NOT DETECTED  11/15/2018 <20 NOT DETECTED   CD4 T Cell Abs (/uL)  Date Value  02/15/2020 1,150  09/20/2019 1,608  07/07/2018 871     Assessment & Plan:   Problem List Items Addressed This Visit      High   Human immunodeficiency virus (HIV) disease (HCC) - Primary (Chronic)    [name] looks great today. He seems to be doing well and taking his medication regularly. I am proud of him and expressed this to him today.  I gave him the number to dental scheduler.  Will update pertinent labs including routine STI screening.  He would like to discuss possible cabenuva injections. I expressed my concerns that he does not come frequently enough for appointments. Will continue biktarvy for now and see how he does with return visits.  Return in about 3 months (around 05/15/2020).       Relevant Medications   bictegravir-emtricitabine-tenofovir AF (BIKTARVY) 50-200-25 MG TABS  tablet   Other Relevant Orders   T-helper cell (CD4)- ([facility] clinic only) (Completed)   RPR (Completed)   COMPLETE METABOLIC PANEL WITH GFR (Completed)   CBC with Differential/Platelet (Completed)   Urine cytology ancillary only (Completed)   HIV RNA, RTPCR W/R GT (RTI, PI,INT) (Completed)     Unprioritized   History of syphilis    Screen today. No findings concerning for new exposure.       Essential hypertension    BP Readings from Last 3 Encounters:  10/11/19 (!) 147/115  02/10/19 124/71  01/17/19 (!) 143/89   Refill atenolol for him. Follow at next appointment.       Relevant Medications   atenolol (TENORMIN) 50 MG tablet   Change in color of pigmented skin lesion    He has had pretty rapid changes to the skin near the left eye. It is raised macule that as some dimpling in the center. Will refer to dermatology for help with further work up and ID.       Relevant Orders   Ambulatory referral to Dermatology     Return in about 3 months (around 05/15/2020).    [name], MSN, NP-C [facility] Pager: [phone] Office: [phone]  04/23/20  10:22 PM

## 2020-02-16 LAB — URINE CYTOLOGY ANCILLARY ONLY
Chlamydia: NEGATIVE
Comment: NEGATIVE
Comment: NORMAL
Neisseria Gonorrhea: NEGATIVE

## 2020-02-16 LAB — T-HELPER CELL (CD4) - (RCID CLINIC ONLY)
CD4 % Helper T Cell: 41 % (ref 33–65)
CD4 T Cell Abs: 1150 /uL (ref 400–1790)

## 2020-02-28 LAB — CBC WITH DIFFERENTIAL/PLATELET
Absolute Monocytes: 676 cells/uL (ref 200–950)
Basophils Absolute: 88 cells/uL (ref 0–200)
Basophils Relative: 0.9 %
Eosinophils Absolute: 333 cells/uL (ref 15–500)
Eosinophils Relative: 3.4 %
HCT: 46 % (ref 38.5–50.0)
Hemoglobin: 15.7 g/dL (ref 13.2–17.1)
Lymphs Abs: 3401 cells/uL (ref 850–3900)
MCH: 28.4 pg (ref 27.0–33.0)
MCHC: 34.1 g/dL (ref 32.0–36.0)
MCV: 83.2 fL (ref 80.0–100.0)
MPV: 9.8 fL (ref 7.5–12.5)
Monocytes Relative: 6.9 %
Neutro Abs: 5302 cells/uL (ref 1500–7800)
Neutrophils Relative %: 54.1 %
Platelets: 258 10*3/uL (ref 140–400)
RBC: 5.53 10*6/uL (ref 4.20–5.80)
RDW: 13.3 % (ref 11.0–15.0)
Total Lymphocyte: 34.7 %
WBC: 9.8 10*3/uL (ref 3.8–10.8)

## 2020-02-28 LAB — RPR: RPR Ser Ql: NONREACTIVE

## 2020-02-28 LAB — COMPLETE METABOLIC PANEL WITH GFR
AG Ratio: 1.6 (calc) (ref 1.0–2.5)
ALT: 21 U/L (ref 9–46)
AST: 22 U/L (ref 10–40)
Albumin: 4.7 g/dL (ref 3.6–5.1)
Alkaline phosphatase (APISO): 56 U/L (ref 36–130)
BUN: 10 mg/dL (ref 7–25)
CO2: 27 mmol/L (ref 20–32)
Calcium: 9.9 mg/dL (ref 8.6–10.3)
Chloride: 99 mmol/L (ref 98–110)
Creat: 0.96 mg/dL (ref 0.60–1.35)
GFR, Est African American: 114 mL/min/{1.73_m2} (ref >=60)
GFR, Est Non African American: 98 mL/min/{1.73_m2} (ref >=60)
Globulin: 3 g/dL (calc) (ref 1.9–3.7)
Glucose, Bld: 91 mg/dL (ref 65–99)
Potassium: 4.4 mmol/L (ref 3.5–5.3)
Sodium: 133 mmol/L — ABNORMAL LOW (ref 135–146)
Total Bilirubin: 0.8 mg/dL (ref 0.2–1.2)
Total Protein: 7.7 g/dL (ref 6.1–8.1)

## 2020-02-28 LAB — HIV RNA, RTPCR W/R GT (RTI, PI,INT)
HIV 1 RNA Quant: <20 copies/mL
HIV-1 RNA Quant, Log: <1.3 Log copies/mL

## 2020-03-06 ENCOUNTER — Ambulatory Visit: Payer: Self-pay

## 2020-04-23 DIAGNOSIS — L819 Disorder of pigmentation, unspecified: Secondary | ICD-10-CM | POA: Insufficient documentation

## 2020-04-23 NOTE — Assessment & Plan Note (Signed)
BP Readings from Last 3 Encounters:  10/11/19 (!) 147/115  02/10/19 124/71  01/17/19 (!) 143/89   Refill atenolol for him. Follow at next appointment.

## 2020-04-23 NOTE — Assessment & Plan Note (Signed)
He has had pretty rapid changes to the skin near the left eye. It is raised macule that as some dimpling in the center. Will refer to dermatology for help with further work up and ID.

## 2020-04-23 NOTE — Assessment & Plan Note (Signed)
Screen today. No findings concerning for new exposure.

## 2020-04-23 NOTE — Assessment & Plan Note (Addendum)
Clayton Erickson looks great today. He seems to be doing well and taking his medication regularly. I am proud of him and expressed this to him today.  I gave him the number to dental scheduler.  Will update pertinent labs including routine STI screening.  He would like to discuss possible cabenuva injections. I expressed my concerns that he does not come frequently enough for appointments. Will continue biktarvy for now and see how he does with return visits.  Return in about 3 months (around 05/15/2020).

## 2020-05-14 ENCOUNTER — Ambulatory Visit: Payer: Self-pay | Admitting: Infectious Diseases

## 2020-05-15 ENCOUNTER — Encounter: Payer: Self-pay | Admitting: Infectious Diseases

## 2020-05-15 ENCOUNTER — Ambulatory Visit: Payer: Self-pay | Admitting: Infectious Diseases

## 2020-05-15 ENCOUNTER — Other Ambulatory Visit: Payer: Self-pay

## 2020-05-15 ENCOUNTER — Telehealth (INDEPENDENT_AMBULATORY_CARE_PROVIDER_SITE_OTHER): Payer: Self-pay | Admitting: Infectious Diseases

## 2020-05-15 DIAGNOSIS — L819 Disorder of pigmentation, unspecified: Secondary | ICD-10-CM

## 2020-05-15 DIAGNOSIS — G8929 Other chronic pain: Secondary | ICD-10-CM

## 2020-05-15 DIAGNOSIS — B20 Human immunodeficiency virus [HIV] disease: Secondary | ICD-10-CM

## 2020-05-15 DIAGNOSIS — M25561 Pain in right knee: Secondary | ICD-10-CM

## 2020-05-15 MED ORDER — BIKTARVY 50-200-25 MG PO TABS: 1.0000 | ORAL_TABLET | Freq: Every day | ORAL | 5 refills | Status: DC

## 2020-05-15 NOTE — Assessment & Plan Note (Signed)
Will try to find a new dermatology team he can afford. Will d/w referral coordinator. He will upload a photo to [employer] for review. I do think it needs a biopsy.

## 2020-05-15 NOTE — Patient Instructions (Addendum)
Nice chatting with you and glad to year you are doing well.   Please call [facility] @ [phone] to schedule a dental appointment   For your knees - ICE ICE ICE. 15 minutes at least every night before bed and a few times during the day if you can.  Voltern Gel - pick up generic brand from pharmacy or [facility]. Use on your knees up to 4 times a day. This is an antiinflammatory medication that I am hopeful will help.   Your CT scan in 2021 showed fluid on your knee - this explains why it feels so tight when you crouch down. If the ice and other measures don't help I would recommend having you seen by a sports medicine or orthopedic doctor to pull fluid off your knee to get some relief.   Please call back for a visit in 6 months. We can do in office and same day labs for you.

## 2020-05-15 NOTE — Assessment & Plan Note (Signed)
CT scans reviewed from 2021. Effusion noted wtihout any arthritis or other anatomic pathology. He describes mostly swelling and tightness without any crepitus or clicking/popping. This is a chronic intermittent problem.  Conservative management with frequent icing of the knee, topical diclofenac. Referral to sports medicine for consideration of therapeutic arthrocentesis if he would like.

## 2020-05-15 NOTE — Assessment & Plan Note (Signed)
Doing well on biktarvy once daily with well controlled HIV - VL < 20 and CD4 1150 in December.  Will have him continue treatment plan. Financial will call him today to help with adap renewal.  RTC in 6 months - labs same day is fine. He will call back to schedule.   Dental contact information provided to him today.

## 2020-05-15 NOTE — Progress Notes (Signed)
Name: Clayton Erickson  DOB: Jul 28, 1980 MRN: [id number] PCP: Shayne Alken, MD   VIRTUAL CARE ENCOUNTER  I connected with Clayton Erickson on 05/15/20 at  4:00 PM EDT by TELEPHONE and verified that I am speaking with the correct person using two identifiers.   I discussed the limitations, risks, security and privacy concerns of performing an evaluation and management service by telephone and the availability of in person appointments. I also discussed with the patient that there may be a patient responsible charge related to this service. The patient expressed understanding and agreed to proceed.  Patient Location: [place] residence   Other Participants:   Provider Location: [facility] Office    Brief Narrative:  Clayton Erickson is a 40 y.o. man with HIV infection that was originally diagnosed in 2002. His CD4 nadir is "above 200". History of OIs: none.  HIV Risk: MSM.  Previously in care at [facility], [place] [phone]  Previous Regimens: . Complera 2016 >> suppressed  . Odefsey 2018 (poor adherence) . Biktarvy 11-2018   Genotypes: . 10-2017 >> no RT, PI or INSTI resistance   Subjective:  CC:  Follow up for HIV care.   Changes in skin on left eye  Needs dental visit for routine care.    HPI:  Clayton Erickson is here for follow up. At work currently. Doing well and no concerns about his medication. He is wondering if he should meet to renew adap soon.  No missed doses of Biktarvy or concern for side effects. Still with long term partner.   Went to dermatology but cold not afford it ($100 - 500). The spot on the face is still dark and with a bump. He will upload a Secretary/administrator for an update.   Struggles with knee pain and stiffness off and on. Nothing seems to help it go away completely regarding OTC meds. He notices it most when he crouches down - feels extremely tight.   Needs number to dental - nonurgent cleaning needed.    Review of Systems   Constitutional: Negative for chills, fever, malaise/fatigue and weight loss.  HENT: Negative for sore throat.   Respiratory: Negative for cough, sputum production and shortness of breath.   Cardiovascular: Negative.   Gastrointestinal: Negative for abdominal pain, diarrhea and vomiting.  Musculoskeletal: Positive for joint pain. Negative for myalgias and neck pain.  Skin: Positive for rash.  Neurological: Negative for headaches.  Psychiatric/Behavioral: Negative for depression and substance abuse. The patient is not nervous/anxious.     Past Medical History:  Diagnosis Date  . Boils   . Decreased thyroid stimulating hormone (TSH) level 01/2019  . HIV infection (HCC)   . Hypertension   . Low vitamin D level   . Vitamin D deficiency 01/2019    Outpatient Medications Prior to Visit  Medication Sig Dispense Refill  . atenolol (TENORMIN) 50 MG tablet Take 1 tablet (50 mg total) by mouth daily. 30 tablet 3  . bictegravir-emtricitabine-tenofovir AF (BIKTARVY) 50-200-25 MG TABS tablet Take 1 tablet by mouth daily. 30 tablet 4  . gabapentin (NEURONTIN) 100 MG capsule Take 1 capsule (100 mg total) by mouth 3 (three) times daily. 90 capsule 2  . Vitamin D, Ergocalciferol, (DRISDOL) 1.25 MG (50000 UT) CAPS capsule Take 1 capsule (50,000 Units total) by mouth every 7 (seven) days. 5 capsule 6   No facility-administered medications prior to visit.    No Known Allergies   Social History   Substance and Sexual Activity  Sexual  Activity Yes  . Partners: Male   Comment: declined condoms    Objective:   There were no vitals filed for this visit. There is no height or weight on file to calculate BMI.  Physical Exam Pulmonary:     Effort: Pulmonary effort is normal.     Comments: No shortness of breath detected in conversation.  Neurological:     Mental Status: He is oriented to person, place, and time.  Psychiatric:        Mood and Affect: Mood normal.        Behavior: Behavior  normal.        Thought Content: Thought content normal.        Judgment: Judgment normal.      Lab Results Lab Results  Component Value Date   WBC 9.8 02/15/2020   HGB 15.7 02/15/2020   HCT 46.0 02/15/2020   MCV 83.2 02/15/2020   PLT 258 02/15/2020    Lab Results  Component Value Date   CREATININE 0.96 02/15/2020   BUN 10 02/15/2020   NA 133 (L) 02/15/2020   K 4.4 02/15/2020   CL 99 02/15/2020   CO2 27 02/15/2020    Lab Results  Component Value Date   ALT 21 02/15/2020   AST 22 02/15/2020   ALKPHOS 55 09/06/2014   BILITOT 0.8 02/15/2020    Lab Results  Component Value Date   CHOL 173 02/10/2019   HDL 92 02/10/2019   LDLCALC 71 02/10/2019   TRIG 49 02/10/2019   CHOLHDL 1.9 02/10/2019   HIV 1 RNA Quant (copies/mL)  Date Value  02/15/2020 <20 DETECTED  09/20/2019 <20 NOT DETECTED  11/15/2018 <20 NOT DETECTED   CD4 T Cell Abs (/uL)  Date Value  02/15/2020 1,150  09/20/2019 1,608  07/07/2018 871     Assessment & Plan:   Problem List Items Addressed This Visit      High   Human immunodeficiency virus (HIV) disease (HCC) (Chronic)    Doing well on biktarvy once daily with well controlled HIV - VL < 20 and CD4 1150 in December.  Will have him continue treatment plan. Financial will call him today to help with adap renewal.  RTC in 6 months - labs same day is fine. He will call back to schedule.   Dental contact information provided to him today.         Unprioritized   Right knee pain    CT scans reviewed from 2021. Effusion noted wtihout any arthritis or other anatomic pathology. He describes mostly swelling and tightness without any crepitus or clicking/popping. This is a chronic intermittent problem.  Conservative management with frequent icing of the knee, topical diclofenac. Referral to sports medicine for consideration of therapeutic arthrocentesis if he would like.       Change in color of pigmented skin lesion    Will try to find a new  dermatology team he can afford. Will d/w referral coordinator. He will upload a photo to [employer] for review. I do think it needs a biopsy.          Follow Up Instructions: As above    I discussed the assessment and treatment plan with the patient. The patient was provided an opportunity to ask questions and all were answered. The patient agreed with the plan and demonstrated an understanding of the instructions.   The patient was advised to call back or seek an in-person evaluation if the symptoms worsen or if the condition fails to  improve as anticipated.  I provided 13 minutes of non-face-to-face time during this encounter.   [name], MSN, NP-C [facility]ious Disease [facility]  [email or web address] Pager: [phone] Office: [phone] [facility] Main Line: [phone]    [name], MSN, NP-C [facility] Pager: [phone] Office: [phone]  05/15/20  4:30 PM

## 2020-10-04 IMAGING — CT CT KNEE*R* W/O CM
3 series · 12 of 33 positions shown, 14 images · non-contrast
Comparison: X-ray 02/10/2019

CLINICAL DATA: Chronic right knee pain and swelling

EXAM:
CT OF THE RIGHT KNEE WITHOUT CONTRAST
TECHNIQUE: Multidetector CT imaging of the right knee was performed according
to the standard protocol. Multiplanar CT image reconstructions were
also generated.

[Series 9: axial st · axial · 0.45mm/px · z∈[-1237,-1032]mm · 4 of 199 slices shown, 5 images]
[im 31/199  soft-tissue]
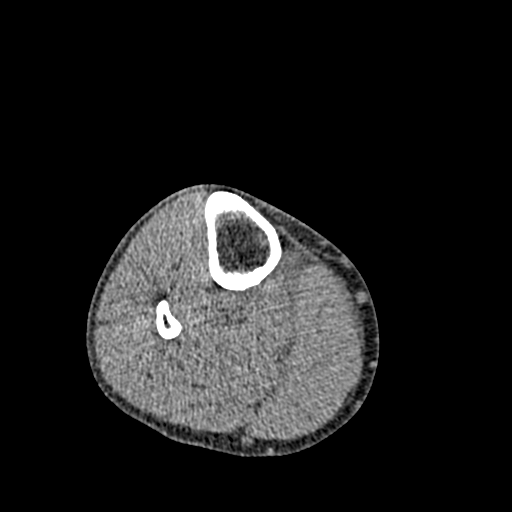
[im 31/199  bone]
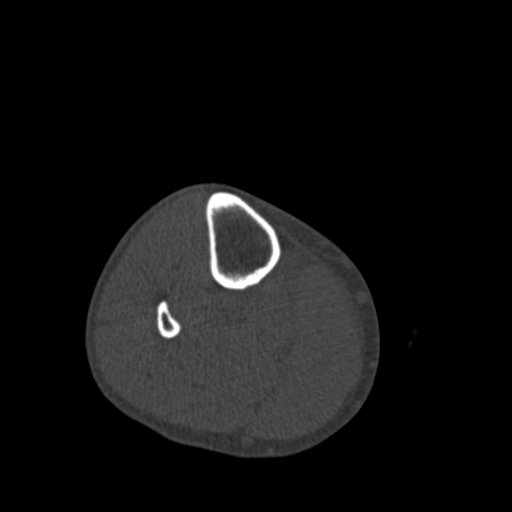
[im 77/199  bone]
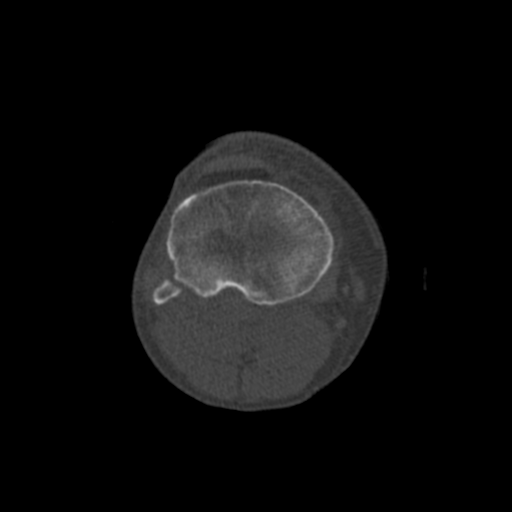
[im 122/199  bone]
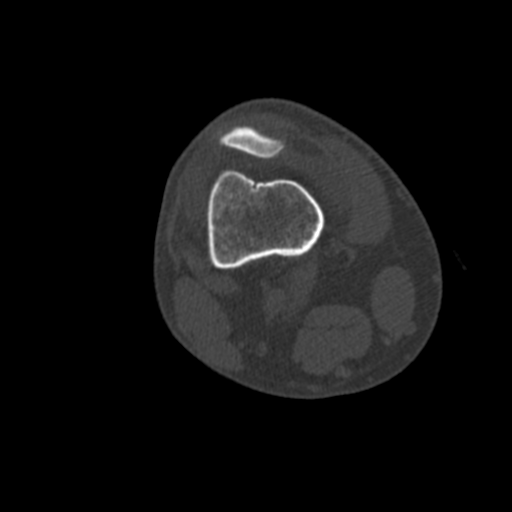
[im 168/199  bone]
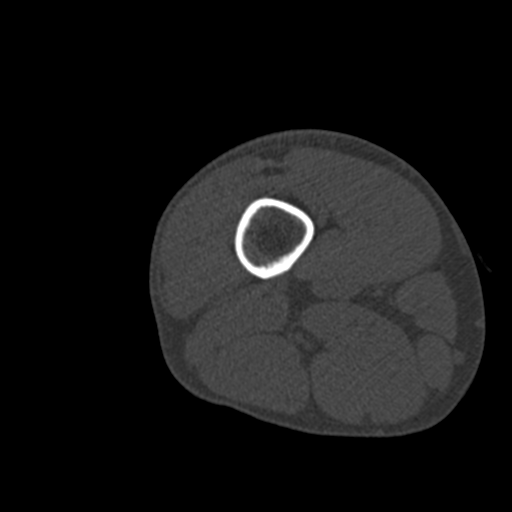

[Series 10: coronal st · coronal · 0.32mm/px · 3 of 121 slices shown]
[im 25/121  bone]
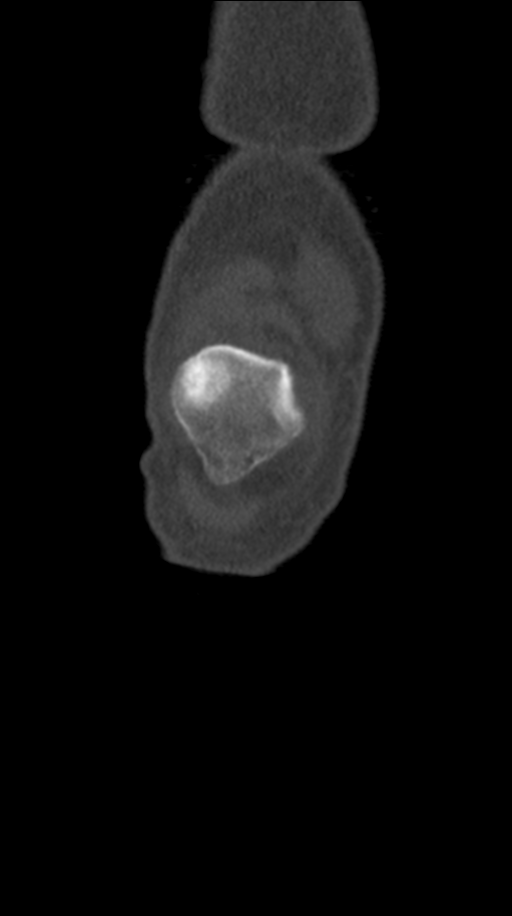
[im 49/121  bone]
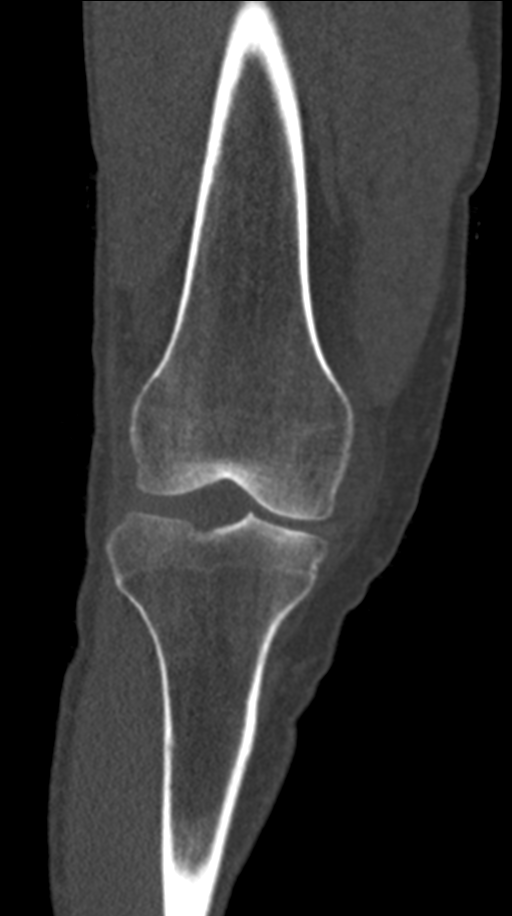
[im 73/121  bone]
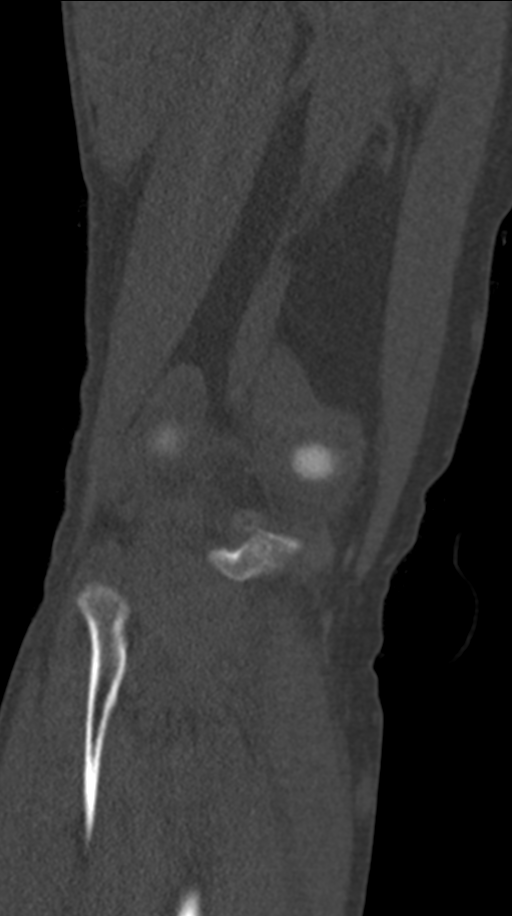

[Series 11: sagittal st · sagittal · 0.37mm/px · 5 of 120 slices shown, 6 images]
[im 40/120  bone]
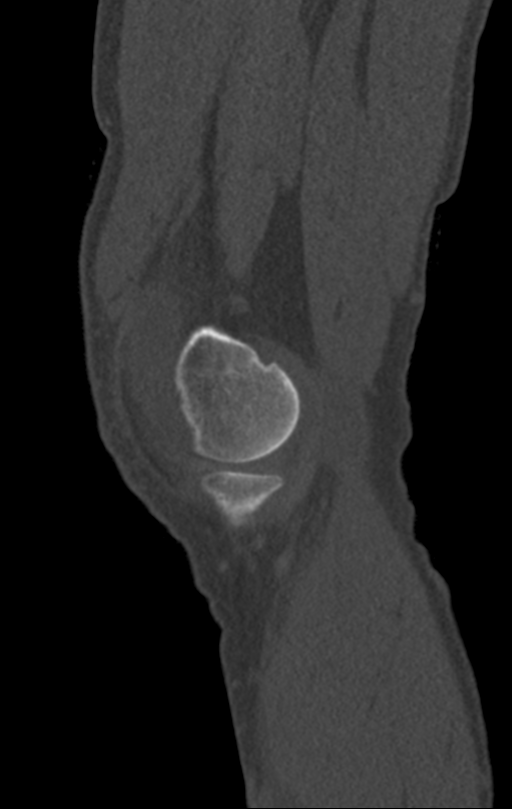
[im 50/120  bone]
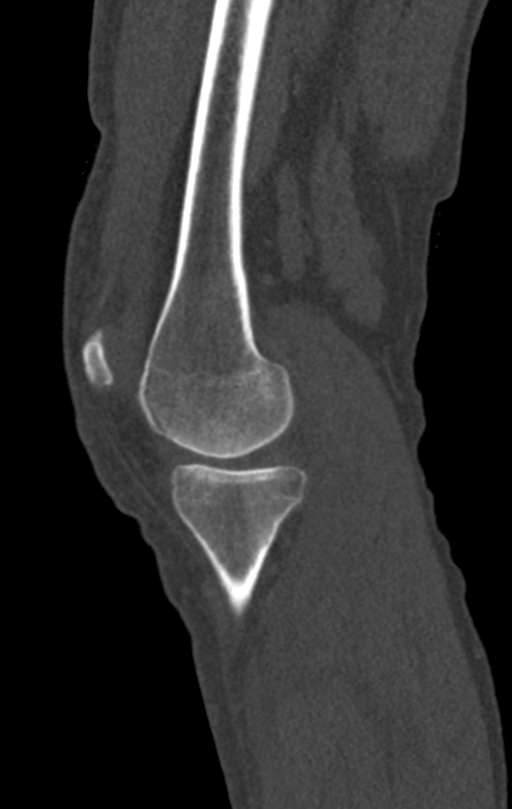
[im 60/120  soft-tissue]
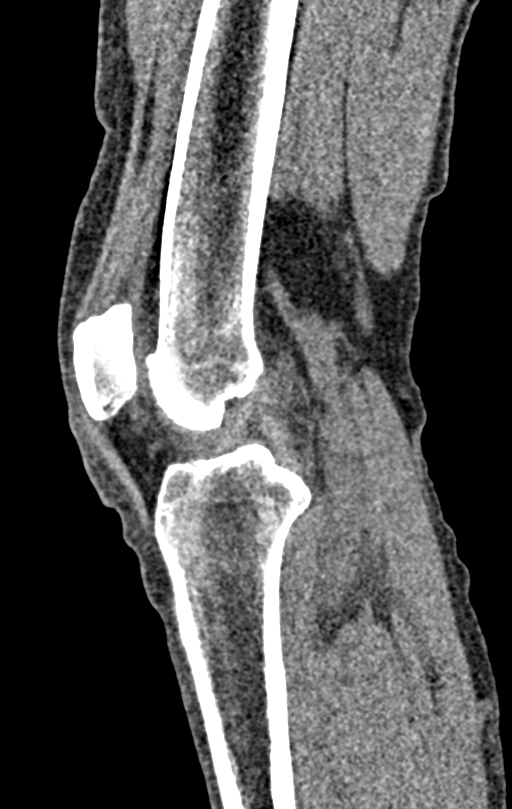
[im 60/120  bone]
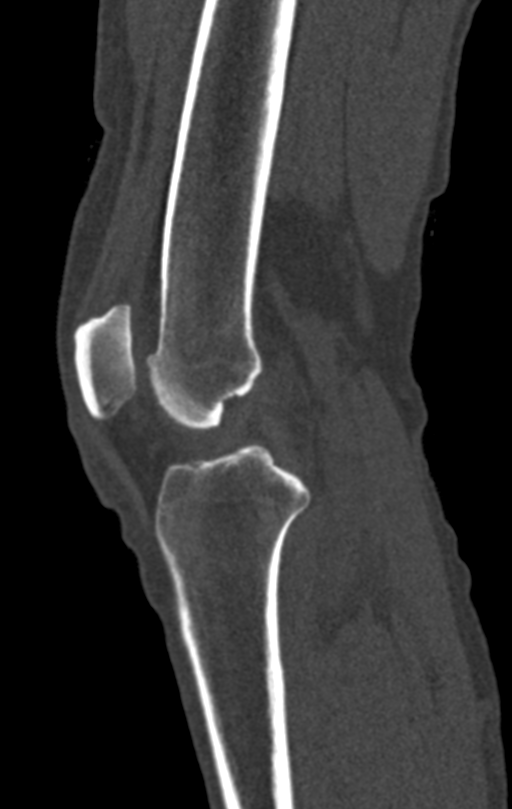
[im 70/120  bone]
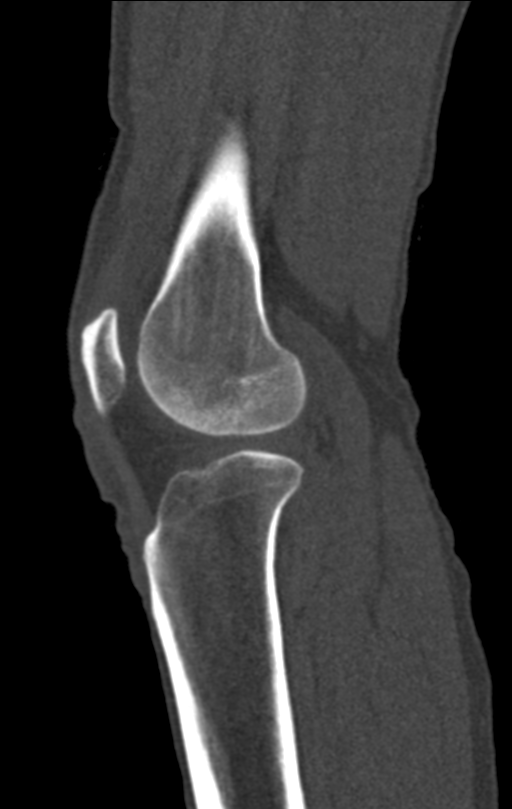
[im 80/120  bone]
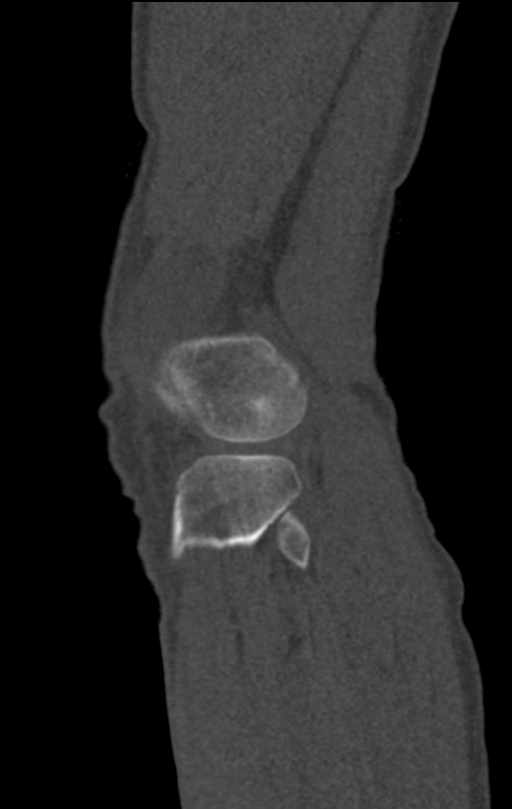

[12 of 33 positions shown; findings below may reference images not displayed]

FINDINGS: Bones/Joint/Cartilage

No acute fracture. No malalignment. Joint spaces are maintained
without significant arthropathy. No evidence for an osteochondral
lesion. There is a ossified 9 mm intra-articular body at the
posterior joint line (series 9, image 114). Small to moderate-sized
knee joint effusion.

Ligaments

Suboptimally assessed by CT. The anterior and posterior cruciate
ligaments appear to be grossly intact on soft tissue windows,
although CT suboptimally evaluate the structures.

Muscles and Tendons

Quadriceps and patellar tendons appear intact. No gross tendinous
abnormality. Normal muscle bulk.

Soft tissues

Mild prepatellar soft tissue edema. No soft tissue hematoma. No
radiopaque foreign body
IMPRESSION: 1. No acute fracture or malalignment. If concern for internal
derangement, further evaluation with MRI should be considered.
2. Small to moderate-sized knee joint effusion with a ossified
intra-articular body at the posterior joint line.
3. Mild prepatellar soft tissue edema.

## 2020-10-31 ENCOUNTER — Encounter: Payer: Self-pay | Admitting: Infectious Diseases

## 2020-10-31 ENCOUNTER — Other Ambulatory Visit: Payer: Self-pay

## 2020-10-31 ENCOUNTER — Ambulatory Visit (INDEPENDENT_AMBULATORY_CARE_PROVIDER_SITE_OTHER): Payer: Self-pay | Admitting: Infectious Diseases

## 2020-10-31 VITALS — BP 162/103 | HR 83 | Temp 98.6°F | Wt 205.0 lb

## 2020-10-31 DIAGNOSIS — B360 Pityriasis versicolor: Secondary | ICD-10-CM | POA: Insufficient documentation

## 2020-10-31 DIAGNOSIS — K0510 Chronic gingivitis, plaque induced: Secondary | ICD-10-CM

## 2020-10-31 DIAGNOSIS — B20 Human immunodeficiency virus [HIV] disease: Secondary | ICD-10-CM

## 2020-10-31 MED ORDER — CHLORHEXIDINE GLUCONATE 0.12% ORAL RINSE (MEDLINE KIT): 15.0000 mL | Freq: Every evening | OROMUCOSAL | 1 refills | Status: DC

## 2020-10-31 MED ORDER — CHLORHEXIDINE GLUCONATE 0.12 % MT SOLN: 15.0000 mL | Freq: Two times a day (BID) | OROMUCOSAL | 0 refills | Status: DC

## 2020-10-31 MED ORDER — BIKTARVY 50-200-25 MG PO TABS: 1.0000 | ORAL_TABLET | Freq: Every day | ORAL | 5 refills | Status: DC

## 2020-10-31 NOTE — Progress Notes (Signed)
Name: Clayton Erickson  DOB: [birth year] MRN: [id number] PCP: Shayne Alken, MD     Brief Narrative:  Clayton Erickson is a 40 y.o. man with HIV infection that was originally diagnosed in 2002. His CD4 nadir is "above 200". History of OIs: none.  HIV Risk: MSM.  Previously in care at [facility], [place] [phone]  Previous Regimens: Complera 2016 >> suppressed  Odefsey 2018 (poor adherence) Biktarvy 11-2018   Genotypes: 10-2017 >> no RT, PI or INSTI resistance   Subjective:   Chief Complaint  Patient presents with   Follow-up    B20 - Pt requesting blood work to see if diabetic. Pt reports mild stomach pain after taking Biktarvy. Pt reports having a rash on back of neck, shoulders and back - that has been there for a couple years.       HPI:  Doing well since our last OV together. Knee pain/swelling has resolved, though they are noisy at times. Continues biktarvy every day without missed dose. One partner, monogamous. Needs to see financial today. Has questions about [facility] bills he is getting from an old visit.   New rash on his back and neck that is itchy and light colored. Has noticed it for a while now and his partner has the same. No drainage and overall it feels very flat. Has been using CereVu moisturizer that helps the itching and Dove soap.   Learned his father is diabetic and would like screening today to make sure he is OK.   Requesting Rx for mouth rinse that dental team recommended he use nightly to help gingivitis. Can't recall what it's called.    Review of Systems  Constitutional:  Negative for chills, fever, malaise/fatigue and weight loss.  HENT:  Negative for sore throat.        No dental problems  Respiratory:  Negative for cough and sputum production.   Cardiovascular:  Negative for chest pain and leg swelling.  Gastrointestinal:  Negative for abdominal pain, diarrhea and vomiting.  Genitourinary:  Negative for  dysuria and flank pain.  Musculoskeletal:  Negative for joint pain, myalgias and neck pain.  Skin:  Positive for rash.  Neurological:  Negative for dizziness, tingling and headaches.  Psychiatric/Behavioral:  Negative for depression and substance abuse. The patient is not nervous/anxious and does not have insomnia.   All other systems reviewed and are negative.  Past Medical History:  Diagnosis Date   Boils    Decreased thyroid stimulating hormone (TSH) level 01/2019   HIV infection (HCC)    Hypertension    Low vitamin D level    Vitamin D deficiency 01/2019    Outpatient Medications Prior to Visit  Medication Sig Dispense Refill   atenolol (TENORMIN) 50 MG tablet Take 1 tablet (50 mg total) by mouth daily. 30 tablet 3   bictegravir-emtricitabine-tenofovir AF (BIKTARVY) 50-200-25 MG TABS tablet Take 1 tablet by mouth daily. 30 tablet 5   gabapentin (NEURONTIN) 100 MG capsule Take 1 capsule (100 mg total) by mouth 3 (three) times daily. (Patient not taking: Reported on 10/31/2020) 90 capsule 2   Vitamin D, Ergocalciferol, (DRISDOL) 1.25 MG (50000 UT) CAPS capsule Take 1 capsule (50,000 Units total) by mouth every 7 (seven) days. (Patient not taking: Reported on 10/31/2020) 5 capsule 6   No facility-administered medications prior to visit.    No Known Allergies   Social History   Substance and Sexual Activity  Sexual Activity Yes   Partners: Male  Comment: declined condoms    Objective:   Vitals:   10/31/20 1127  BP: (!) 162/103  Pulse: 83  Temp: 98.6 F (37 C)  TempSrc: Oral  SpO2: 97%  Weight: 205 lb (93 kg)   Body mass index is 28.59 kg/m.  Physical Exam Vitals reviewed.  Constitutional:      Appearance: Normal appearance. He is not ill-appearing.  HENT:     Head: Normocephalic.     Mouth/Throat:     Mouth: Mucous membranes are moist.     Pharynx: Oropharynx is clear.  Eyes:     General: No scleral icterus. Pulmonary:     Effort: Pulmonary effort is  normal.  Musculoskeletal:        General: Normal range of motion.     Cervical back: Normal range of motion.  Skin:    Coloration: Skin is not jaundiced or pale.     Comments: Hypopigmented macular rash along his neck and upper back. Some coalesced. Flat and without any drainage.   Neurological:     Mental Status: He is alert and oriented to person, place, and time.  Psychiatric:        Mood and Affect: Mood normal.        Judgment: Judgment normal.     Lab Results Lab Results  Component Value Date   WBC 9.8 02/15/2020   HGB 15.7 02/15/2020   HCT 46.0 02/15/2020   MCV 83.2 02/15/2020   PLT 258 02/15/2020    Lab Results  Component Value Date   CREATININE 0.96 02/15/2020   BUN 10 02/15/2020   NA 133 (L) 02/15/2020   K 4.4 02/15/2020   CL 99 02/15/2020   CO2 27 02/15/2020    Lab Results  Component Value Date   ALT 21 02/15/2020   AST 22 02/15/2020   ALKPHOS 55 09/06/2014   BILITOT 0.8 02/15/2020    Lab Results  Component Value Date   CHOL 173 02/10/2019   HDL 92 02/10/2019   LDLCALC 71 02/10/2019   TRIG 49 02/10/2019   CHOLHDL 1.9 02/10/2019   HIV 1 RNA Quant (copies/mL)  Date Value  02/15/2020 <20 DETECTED  09/20/2019 <20 NOT DETECTED  11/15/2018 <20 NOT DETECTED   CD4 T Cell Abs (/uL)  Date Value  02/15/2020 1,150  09/20/2019 1,608  07/07/2018 871     Assessment & Plan:   Problem List Items Addressed This Visit       High   Human immunodeficiency virus (HIV) disease (HCC) (Chronic)    Under perfect control on once daily Biktarvy. Last set of labs reviewed - VL < 20 and CD4 > 1000. Will update today including CMP, CBC, RPR, urine cytology, lipids.  Diabetes screen today with family history - I suspect he does not have it given normal blood sugars in the past.   Flu vaccine recommended as soon as he is able to get it.  Has access to care with dental team here.  Return in about 6 months (around 04/30/2021).       Relevant Medications    bictegravir-emtricitabine-tenofovir AF (BIKTARVY) 50-200-25 MG TABS tablet   Other Relevant Orders   HIV-1 RNA quant-no reflex-bld   RPR   T-helper cell (CD4)- ([facility] clinic only)   COMPLETE METABOLIC PANEL WITH GFR   Lipid panel   Urine cytology ancillary only     Unprioritized   Pityriasis versicolor    Will treat with topical selenium sulfide /selsun blue shampoo daily. If no improvement can  help with Rx topical option or consider weekly diflucan 300 mg (on adap). Advised it can take up to 4 weeks to resolve.        Other Visit Diagnoses     Gingivitis    -  Primary   Relevant Medications   chlorhexidine (PERIDEX) 0.12 % solution       [name], MSN, NP-C [facility]  [email or web address] Pager: [phone] Office: [phone] [facility] Main Line: [phone]    10/31/20  12:04 PM

## 2020-10-31 NOTE — Patient Instructions (Addendum)
For your rash I suspect this is a yeast infection called tinea versicolor.   Start using [employer] as a body wash to help this go away. Sometimes people need a prescription strength but this should work. Give it a few weeks to go away but you should start seeing  an improvement.   Please stop by the lab on your way out.   Please check in with financial team to re-apply for your ADAP. Need to do this again in January -  You may want to consider talking with financial about possible program to use the ADAP money to pay for health insurance policy for you so you can get medical care at other offices if you need to.   Will see you back in 6 months to check in

## 2020-10-31 NOTE — Addendum Note (Signed)
Addended by: Linna Hoff D on: 10/31/2020 12:29 PM   Modules accepted: Orders

## 2020-10-31 NOTE — Assessment & Plan Note (Signed)
Under perfect control on once daily Biktarvy. Last set of labs reviewed - VL < 20 and CD4 > 1000. Will update today including CMP, CBC, RPR, urine cytology, lipids.  Diabetes screen today with family history - I suspect he does not have it given normal blood sugars in the past.   Flu vaccine recommended as soon as he is able to get it.  Has access to care with dental team here.  Return in about 6 months (around 04/30/2021).

## 2020-10-31 NOTE — Assessment & Plan Note (Signed)
Will treat with topical selenium sulfide /selsun blue shampoo daily. If no improvement can help with Rx topical option or consider weekly diflucan 300 mg (on adap). Advised it can take up to 4 weeks to resolve.

## 2020-11-01 LAB — T-HELPER CELL (CD4) - (RCID CLINIC ONLY)
CD4 % Helper T Cell: 42 % (ref 33–65)
CD4 T Cell Abs: 1306 /uL (ref 400–1790)

## 2020-11-03 LAB — LIPID PANEL
Cholesterol: 192 mg/dL (ref <200)
HDL: 76 mg/dL (ref >=40)
LDL Cholesterol (Calc): 102 mg/dL (calc) — ABNORMAL HIGH
Non-HDL Cholesterol (Calc): 116 mg/dL (calc) (ref <130)
Total CHOL/HDL Ratio: 2.5 (calc) (ref <5.0)
Triglycerides: 49 mg/dL (ref <150)

## 2020-11-03 LAB — COMPLETE METABOLIC PANEL WITH GFR
AG Ratio: 1.6 (calc) (ref 1.0–2.5)
ALT: 17 U/L (ref 9–46)
AST: 20 U/L (ref 10–40)
Albumin: 4.8 g/dL (ref 3.6–5.1)
Alkaline phosphatase (APISO): 58 U/L (ref 36–130)
BUN: 15 mg/dL (ref 7–25)
CO2: 27 mmol/L (ref 20–32)
Calcium: 9.6 mg/dL (ref 8.6–10.3)
Chloride: 98 mmol/L (ref 98–110)
Creat: 0.95 mg/dL (ref 0.60–1.29)
Globulin: 3 g/dL (calc) (ref 1.9–3.7)
Glucose, Bld: 82 mg/dL (ref 65–99)
Potassium: 4.5 mmol/L (ref 3.5–5.3)
Sodium: 133 mmol/L — ABNORMAL LOW (ref 135–146)
Total Bilirubin: 0.5 mg/dL (ref 0.2–1.2)
Total Protein: 7.8 g/dL (ref 6.1–8.1)
eGFR: 104 mL/min/{1.73_m2} (ref >=60)

## 2020-11-03 LAB — RPR: RPR Ser Ql: NONREACTIVE

## 2020-11-03 LAB — HIV-1 RNA QUANT-NO REFLEX-BLD
HIV 1 RNA Quant: NOT DETECTED Copies/mL
HIV-1 RNA Quant, Log: NOT DETECTED Log cps/mL

## 2020-11-18 ENCOUNTER — Ambulatory Visit: Payer: Self-pay | Admitting: Infectious Diseases

## 2021-01-30 ENCOUNTER — Encounter: Payer: Self-pay | Admitting: Infectious Diseases

## 2021-01-31 ENCOUNTER — Telehealth: Payer: Self-pay

## 2021-01-31 NOTE — Telephone Encounter (Signed)
Received call from [facility] specialty pharmacy to confirm patient medication regimen of Biktarvy. They report that they haven't filled his medication since March 11th, 2022. He also requested refills on his atenolol but that hasn't been filled since January 2022. RN requested they fill the Biktarvy and I would follow up to discuss his adherence.   RN spoke with patient who reports he had a few bottles left over which is why he hasn't filled his Biktarvy. RN asked how many bottles he had since March to December is a long time; he reports having 1-2 bottles. States he doesn't take his meds everyday but he got his lab work back recently and he's "doing fine." RN advised that skipping doses increases his risk for developing resistance and reiterated he needs to take the medication daily. Patient expressed interest in Cabenuva injections; RN stated that he needs to show he can remain adherent to the daily pill regimen to decrease any risk of resistance and ensure he's a good candidate for shots. Patient verbalized understanding.   Patient does not have a PCP, stated Judeth Cornfield is his primary care provider. RN informed him that she is a specialist and he needs to see a PCP to manage his atenolol and he can't take that medication occasionally. Patient lives in [place] and is uninsured. He requested a referral to a PCP in the [place] area.   Clayton Gallina Loyola Mast, RN

## 2021-05-05 ENCOUNTER — Encounter: Payer: Self-pay | Admitting: Infectious Diseases

## 2021-05-05 ENCOUNTER — Ambulatory Visit: Payer: Self-pay | Admitting: Infectious Diseases

## 2021-05-05 ENCOUNTER — Other Ambulatory Visit: Payer: Self-pay

## 2021-05-05 VITALS — BP 155/101 | HR 99 | Temp 97.6°F | Wt 196.0 lb

## 2021-05-05 DIAGNOSIS — G8929 Other chronic pain: Secondary | ICD-10-CM

## 2021-05-05 DIAGNOSIS — M79641 Pain in right hand: Secondary | ICD-10-CM | POA: Insufficient documentation

## 2021-05-05 DIAGNOSIS — M79642 Pain in left hand: Secondary | ICD-10-CM

## 2021-05-05 DIAGNOSIS — I10 Essential (primary) hypertension: Secondary | ICD-10-CM

## 2021-05-05 DIAGNOSIS — M25562 Pain in left knee: Secondary | ICD-10-CM

## 2021-05-05 DIAGNOSIS — B20 Human immunodeficiency virus [HIV] disease: Secondary | ICD-10-CM

## 2021-05-05 DIAGNOSIS — M25561 Pain in right knee: Secondary | ICD-10-CM

## 2021-05-05 MED ORDER — ATENOLOL 50 MG PO TABS: 50.0000 mg | ORAL_TABLET | Freq: Every day | ORAL | 3 refills | Status: DC

## 2021-05-05 MED ORDER — BIKTARVY 50-200-25 MG PO TABS: 1.0000 | ORAL_TABLET | Freq: Every day | ORAL | 11 refills | Status: DC

## 2021-05-05 NOTE — Assessment & Plan Note (Addendum)
Resume atenolol 50 mg daily.  ?Discussed how weight reduction may favorably help with lowering BP to where he could come off this medication potentially. He is very hopeful he can drop back to 180 lb over the next 4-6 months.  ?Recommend to stop cigar smoking and implement walking program daily.  ?

## 2021-05-05 NOTE — Progress Notes (Signed)
? ?Name: Cynthia Challender  ?DOB: [birth year] ?MRN: [id number] ?PCP: Rosaria Ferries, MD  ? ? ? ?Brief Narrative:  ?Eugune Lockner is a 41 y.o. man with HIV infection that was originally diagnosed in 2002. His CD4 nadir is "above 200". History of OIs: none.  ?HIV Risk: MSM.  ?Previously in care at [facility] P#[phone] ? ?Previous Regimens: ?Complera 2016 >> suppressed  ?Odefsey 2018 (poor adherence) ?Biktarvy 11-2018  ? ?Genotypes: ?10-2017 >> no RT, PI or INSTI resistance  ? ?Subjective:  ? ?Chief Complaint  ?Patient presents with  ? Follow-up  ?  ? ? ?HPI:  ?Doing well on Biktarvy once daily but ready to switch to the cabenuva shots. Tired of taking HIV pill everyday.  ?Feels like he needs to go back on blood pressure medication too - has noticed some headaches.  ?Working 2 jobs now and on his feet a lot. Still battling knee swelling with painful popping sensation and hand pain in the center of his palms at the base of long finger. Triggers pain with pressure over the site but also has noticed it gets stuck and causes a shooting pain.  ? ? ? ?Review of Systems  ?Constitutional:  Negative for chills and fever.  ?HENT:  Negative for tinnitus.   ?Eyes:  Negative for blurred vision and photophobia.  ?Respiratory:  Negative for cough and sputum production.   ?Cardiovascular:  Negative for chest pain.  ?Gastrointestinal:  Negative for diarrhea, nausea and vomiting.  ?Genitourinary:  Negative for dysuria.  ?Musculoskeletal:  Positive for joint pain. Negative for falls.  ?Skin:  Negative for rash.  ?Neurological:  Negative for headaches.  ? ?Past Medical History:  ?Diagnosis Date  ? Boils   ? Decreased thyroid stimulating hormone (TSH) level 01/2019  ? HIV infection ([place])   ? Hypertension   ? Low vitamin D level   ? Vitamin D deficiency 01/2019  ? ? ?Outpatient Medications Prior to Visit  ?Medication Sig Dispense Refill  ? bictegravir-emtricitabine-tenofovir AF (BIKTARVY)  50-200-25 MG TABS tablet Take 1 tablet by mouth daily. 30 tablet 5  ? chlorhexidine (PERIDEX) 0.12 % solution Use as directed 15 mLs in the mouth or throat 2 (two) times daily. (Patient not taking: Reported on 05/05/2021) 473 mL 0  ? gabapentin (NEURONTIN) 100 MG capsule Take 1 capsule (100 mg total) by mouth 3 (three) times daily. (Patient not taking: Reported on 10/31/2020) 90 capsule 2  ? Vitamin D, Ergocalciferol, (DRISDOL) 1.25 MG (50000 UT) CAPS capsule Take 1 capsule (50,000 Units total) by mouth every 7 (seven) days. (Patient not taking: Reported on 10/31/2020) 5 capsule 6  ? atenolol (TENORMIN) 50 MG tablet Take 1 tablet (50 mg total) by mouth daily. (Patient not taking: Reported on 05/05/2021) 30 tablet 3  ? ?No facility-administered medications prior to visit.  ?  ?No Known Allergies ? ? ?Social History  ? ?Substance and Sexual Activity  ?Sexual Activity Yes  ? Partners: Male  ? Comment: declined condoms  ? ? ?Objective:  ? ?Vitals:  ? 05/05/21 1352  ?BP: (!) 155/101  ?Pulse: 99  ?Temp: 97.6 ?F (36.4 ?C)  ?TempSrc: Oral  ?SpO2: 96%  ?Weight: 196 lb (88.9 kg)  ? ?Body mass index is 27.34 kg/m?. ? ?Physical Exam ?Vitals reviewed.  ?Constitutional:   ?   Appearance: Normal appearance. He is not ill-appearing.  ?HENT:  ?   Head: Normocephalic.  ?   Mouth/Throat:  ?   Mouth: Mucous membranes are moist.  ?  Pharynx: Oropharynx is clear.  ?Eyes:  ?   General: No scleral icterus. ?Pulmonary:  ?   Effort: Pulmonary effort is normal.  ?Musculoskeletal:  ?   Right hand: Bony tenderness present. No swelling or deformity. Decreased range of motion.  ?   Left hand: Bony tenderness present. No swelling or deformity. Decreased range of motion.  ?     Arms: ? ?   Cervical back: Normal range of motion.  ?   Right knee: Swelling and crepitus present. No effusion. Normal range of motion.  ?   Left knee: Swelling, effusion and crepitus present. Normal range of motion.  ?Skin: ?   General: Skin is warm.  ?   Capillary Refill:  Capillary refill takes less than 2 seconds.  ?   Coloration: Skin is not jaundiced or pale.  ?Neurological:  ?   Mental Status: He is alert and oriented to person, place, and time.  ?Psychiatric:     ?   Mood and Affect: Mood normal.     ?   Judgment: Judgment normal.  ? ? ? ?Lab Results ?Lab Results  ?Component Value Date  ? WBC 9.8 02/15/2020  ? HGB 15.7 02/15/2020  ? HCT 46.0 02/15/2020  ? MCV 83.2 02/15/2020  ? PLT 258 02/15/2020  ?  ?Lab Results  ?Component Value Date  ? CREATININE 0.95 10/31/2020  ? BUN 15 10/31/2020  ? NA 133 (L) 10/31/2020  ? K 4.5 10/31/2020  ? CL 98 10/31/2020  ? CO2 27 10/31/2020  ?  ?Lab Results  ?Component Value Date  ? ALT 17 10/31/2020  ? AST 20 10/31/2020  ? ALKPHOS 55 09/06/2014  ? BILITOT 0.5 10/31/2020  ?  ?Lab Results  ?Component Value Date  ? CHOL 192 10/31/2020  ? HDL 76 10/31/2020  ? LDLCALC 102 (H) 10/31/2020  ? TRIG 49 10/31/2020  ? CHOLHDL 2.5 10/31/2020  ? ?HIV 1 RNA Quant  ?Date Value  ?10/31/2020 Not Detected Copies/mL  ?02/15/2020 <20 DETECTED copies/mL  ?09/20/2019 <20 NOT DETECTED copies/mL  ? ?CD4 T Cell Abs (/uL)  ?Date Value  ?10/31/2020 1,306  ?02/15/2020 1,150  ?09/20/2019 1,608  ? ? ? ?Assessment & Plan:  ? ?Problem List Items Addressed This Visit   ? ?  ? High  ? Human immunodeficiency virus (HIV) disease ([place]) - Primary (Chronic)  ?  Well controlled on biktarvy once daily. He has had some lapses surrounding ADAP renewal periods. Aside from this I do think he will do well for [facility] as his appointment attendance has been better and he is quite motivated to liberate from daily pill. Will have him back in 3 weeks (to allow time for his upcoming trip and ADAP to be renewed). I do think if he comes every 2 months to where we can intervene with him on ADAP renewal we can prevent lapse in doses.  ?Will notify pharmacy team. Expected loading dose ~ 3/27 week.  ?Update labs today including organ function.  ?  ?  ? Relevant Medications  ?  bictegravir-emtricitabine-tenofovir AF (BIKTARVY) 50-200-25 MG TABS tablet  ? Other Relevant Orders  ? HIV-1 RNA quant-no reflex-bld  ? COMPLETE METABOLIC PANEL WITH GFR  ? CBC with Differential/Platelet  ? Lipid panel  ? T-helper cells (CD4) count (not at [facility])  ?  ? Unprioritized  ? Essential hypertension  ?  Resume atenolol 50 mg daily.  ?Discussed how weight reduction may favorably help with lowering BP to where he could come off this  medication potentially. He is very hopeful he can drop back to 180 lb over the next 4-6 months.  ?Recommend to stop cigar smoking and implement walking program daily.  ?  ?  ? Relevant Medications  ? atenolol (TENORMIN) 50 MG tablet  ? Knee pain, bilateral  ?  Has been intermittently a problem that seems to correlate with activity and being on his feet a lot. I suspect arthritis given he has crepitus on exam and some swelling without significant effusion. Will treat with topical Diclofenac cream and Tylenol (he will also let me know about a medication he tried that seemed to help).  ?Would be a good candidate for PCAP next insurance enrollment to help get him access covered to other clinics for care.  ?  ?  ? Bilateral hand pain  ?  On exam with some tenderness palpating over the palm of long finger. No warmth or effusions but some generalized swelling.  ?No de Quervain tendinopathy or carpal tunnel symptoms from exam/history.  ? ??Arthitis vs trigger finger/tenosynovitis.  ?Treat with topical Diclofenac and tylenol.  ?  ?  ? ?Janene Madeira, MSN, NP-C ?[place] for Infectious Disease ?[facility]  ?[email or web address] ?Pager: [phone] ?Office: [phone] ?[facility] Main Line: [phone]  ? ? ?05/05/21  ?4:44 PM ? ? ?

## 2021-05-05 NOTE — Assessment & Plan Note (Addendum)
On exam with some tenderness palpating over the palm of long finger. No warmth or effusions but some generalized swelling.  ?No de Quervain tendinopathy or carpal tunnel symptoms from exam/history.  ? ??Arthitis vs trigger finger/tenosynovitis.  ?Treat with topical Diclofenac and tylenol.  ?

## 2021-05-05 NOTE — Patient Instructions (Addendum)
Nice to see you today! ? ?Will need to get you to see our financial team today to renew your ADAP. Don't forget to do the same around July/August.  ? ?Schedule a visit back when you return from your vacation so we can switch you to Cabenuva.  ? ?Please call Claris Che @ [phone] to schedule a dental appointment  ? ?Weight loss may be very helpful to get you off the blood pressure pill.  ? ?Walking is a great first thing to start working on to help lose weight. Try for two 10 minute walks everyday. ? ? ?Schedule a visit back in 3 weeks (so we have time to get your ADAP re-approved) with our pharmacy team to get your first injections done for Cabenuva  ?

## 2021-05-05 NOTE — Assessment & Plan Note (Signed)
Has been intermittently a problem that seems to correlate with activity and being on his feet a lot. I suspect arthritis given he has crepitus on exam and some swelling without significant effusion. Will treat with topical Diclofenac cream and Tylenol (he will also let me know about a medication he tried that seemed to help).  ?Would be a good candidate for PCAP next insurance enrollment to help get him access covered to other clinics for care.  ?

## 2021-05-05 NOTE — Assessment & Plan Note (Signed)
Well controlled on biktarvy once daily. He has had some lapses surrounding ADAP renewal periods. Aside from this I do think he will do well for [facility] as his appointment attendance has been better and he is quite motivated to liberate from daily pill. Will have him back in 3 weeks (to allow time for his upcoming trip and ADAP to be renewed). I do think if he comes every 2 months to where we can intervene with him on ADAP renewal we can prevent lapse in doses.  ?Will notify pharmacy team. Expected loading dose ~ 3/27 week.  ?Update labs today including organ function.  ?

## 2021-05-07 LAB — CBC WITH DIFFERENTIAL/PLATELET
Absolute Monocytes: 588 cells/uL (ref 200–950)
Basophils Absolute: 39 cells/uL (ref 0–200)
Basophils Relative: 0.4 %
Eosinophils Absolute: 127 cells/uL (ref 15–500)
Eosinophils Relative: 1.3 %
HCT: 44.5 % (ref 38.5–50.0)
Hemoglobin: 14.8 g/dL (ref 13.2–17.1)
Lymphs Abs: 2754 cells/uL (ref 850–3900)
MCH: 27.5 pg (ref 27.0–33.0)
MCHC: 33.3 g/dL (ref 32.0–36.0)
MCV: 82.6 fL (ref 80.0–100.0)
MPV: 10 fL (ref 7.5–12.5)
Monocytes Relative: 6 %
Neutro Abs: 6292 cells/uL (ref 1500–7800)
Neutrophils Relative %: 64.2 %
Platelets: 253 10*3/uL (ref 140–400)
RBC: 5.39 10*6/uL (ref 4.20–5.80)
RDW: 14.5 % (ref 11.0–15.0)
Total Lymphocyte: 28.1 %
WBC: 9.8 10*3/uL (ref 3.8–10.8)

## 2021-05-07 LAB — COMPLETE METABOLIC PANEL WITH GFR
AG Ratio: 1.6 (calc) (ref 1.0–2.5)
ALT: 14 U/L (ref 9–46)
AST: 23 U/L (ref 10–40)
Albumin: 4.7 g/dL (ref 3.6–5.1)
Alkaline phosphatase (APISO): 61 U/L (ref 36–130)
BUN: 13 mg/dL (ref 7–25)
CO2: 27 mmol/L (ref 20–32)
Calcium: 10.2 mg/dL (ref 8.6–10.3)
Chloride: 98 mmol/L (ref 98–110)
Creat: 1.09 mg/dL (ref 0.60–1.29)
Globulin: 3 g/dL (calc) (ref 1.9–3.7)
Glucose, Bld: 91 mg/dL (ref 65–99)
Potassium: 4.3 mmol/L (ref 3.5–5.3)
Sodium: 135 mmol/L (ref 135–146)
Total Bilirubin: 0.7 mg/dL (ref 0.2–1.2)
Total Protein: 7.7 g/dL (ref 6.1–8.1)
eGFR: 87 mL/min/{1.73_m2} (ref >=60)

## 2021-05-07 LAB — LIPID PANEL
Cholesterol: 177 mg/dL (ref <200)
HDL: 73 mg/dL (ref >=40)
LDL Cholesterol (Calc): 85 mg/dL (calc)
Non-HDL Cholesterol (Calc): 104 mg/dL (calc) (ref <130)
Total CHOL/HDL Ratio: 2.4 (calc) (ref <5.0)
Triglycerides: 93 mg/dL (ref <150)

## 2021-05-07 LAB — HIV-1 RNA QUANT-NO REFLEX-BLD
HIV 1 RNA Quant: NOT DETECTED Copies/mL
HIV-1 RNA Quant, Log: NOT DETECTED Log cps/mL

## 2021-05-07 LAB — T-HELPER CELLS (CD4) COUNT (NOT AT ARMC)
Absolute CD4: 1095 cells/uL (ref 490–1740)
CD4 T Helper %: 41 % (ref 30–61)
Total lymphocyte count: 2654 cells/uL (ref 850–3900)

## 2021-05-12 MED ORDER — CABOTEGRAVIR & RILPIVIRINE ER 600 & 900 MG/3ML IM SUER: 1.0000 | INTRAMUSCULAR | 11 refills | Status: DC

## 2021-05-12 MED ORDER — CABOTEGRAVIR & RILPIVIRINE ER 600 & 900 MG/3ML IM SUER: 1.0000 | Freq: Once | INTRAMUSCULAR | 0 refills | Status: AC

## 2021-05-12 NOTE — Addendum Note (Signed)
Addended by: Blanchard Kelch on: 05/12/2021 09:11 AM ? ? Modules accepted: Orders ? ?

## 2021-05-26 ENCOUNTER — Other Ambulatory Visit: Payer: Self-pay

## 2021-05-26 ENCOUNTER — Ambulatory Visit (INDEPENDENT_AMBULATORY_CARE_PROVIDER_SITE_OTHER): Payer: Self-pay | Admitting: Pharmacist

## 2021-05-26 DIAGNOSIS — Z23 Encounter for immunization: Secondary | ICD-10-CM

## 2021-05-26 DIAGNOSIS — B20 Human immunodeficiency virus [HIV] disease: Secondary | ICD-10-CM

## 2021-05-26 MED ORDER — CABOTEGRAVIR & RILPIVIRINE ER 600 & 900 MG/3ML IM SUER
1.0000 | Freq: Once | INTRAMUSCULAR | Status: AC
  Administered 2021-05-26: 1 via INTRAMUSCULAR

## 2021-05-26 NOTE — Progress Notes (Addendum)
? ?HPI: Clayton Erickson is a 41 y.o. male who presents to the [facility] for [name] administration. ? ?Patient Active Problem List  ? Diagnosis Date Noted  ? Bilateral hand pain 05/05/2021  ? Knee pain, bilateral 02/12/2019  ? Low vitamin D level 12/15/2017  ? Essential hypertension 08/10/2008  ? Anxiety state 11/18/2007  ? History of syphilis 10/28/2007  ? Human immunodeficiency virus (HIV) disease ([name]) 03/10/2006  ? ? ?Patient's Medications  ?New Prescriptions  ? No medications on file  ?Previous Medications  ? ATENOLOL (TENORMIN) 50 MG TABLET    Take 1 tablet (50 mg total) by mouth daily.  ? CABOTEGRAVIR & RILPIVIRINE ER (CABENUVA) 600 & 900 MG/3ML INJECTION    Inject 1 kit into the muscle every 8 (eight) weeks.  ? CHLORHEXIDINE (PERIDEX) 0.12 % SOLUTION    Use as directed 15 mLs in the mouth or throat 2 (two) times daily.  ? GABAPENTIN (NEURONTIN) 100 MG CAPSULE    Take 1 capsule (100 mg total) by mouth 3 (three) times daily.  ? VITAMIN D, ERGOCALCIFEROL, (DRISDOL) 1.25 MG (50000 UT) CAPS CAPSULE    Take 1 capsule (50,000 Units total) by mouth every 7 (seven) days.  ?Modified Medications  ? No medications on file  ?Discontinued Medications  ? No medications on file  ? ? ?Allergies: ?No Known Allergies ? ?Past Medical History: ?Past Medical History:  ?Diagnosis Date  ? Boils   ? Decreased thyroid stimulating hormone (TSH) level 01/2019  ? HIV infection ([place])   ? Hypertension   ? Low vitamin D level   ? Vitamin D deficiency 01/2019  ? ? ?Social History: ?Social History  ? ?Socioeconomic History  ? Marital status: Single  ?  Spouse name: Not on file  ? Number of children: Not on file  ? Years of education: Not on file  ? Highest education level: Not on file  ?Occupational History  ? Not on file  ?Tobacco Use  ? Smoking status: Some Days  ?  Types: Cigarettes  ? Smokeless tobacco: Never  ?Vaping Use  ? Vaping Use: Not on file  ?Substance and Sexual Activity  ? Alcohol use: Yes  ?  Comment: occ  ? Drug  use: Yes  ?  Frequency: 30.0 times per week  ?  Types: Marijuana  ? Sexual activity: Yes  ?  Partners: Male  ?  Comment: declined condoms  ?Other Topics Concern  ? Not on file  ?Social History Narrative  ? Not on file  ? ?Social Determinants of Health  ? ?Financial Resource Strain: Not on file  ?Food Insecurity: Not on file  ?Transportation Needs: Not on file  ?Physical Activity: Not on file  ?Stress: Not on file  ?Social Connections: Not on file  ? ? ?Labs: ?Lab Results  ?Component Value Date  ? HIV1RNAQUANT Not Detected 05/05/2021  ? HIV1RNAQUANT Not Detected 10/31/2020  ? HIV1RNAQUANT <20 DETECTED 02/15/2020  ? CD4TABS 1,306 10/31/2020  ? CD4TABS 1,150 02/15/2020  ? CD4TABS 1,608 09/20/2019  ? ? ?RPR and STI ?Lab Results  ?Component Value Date  ? LABRPR NON-REACTIVE 10/31/2020  ? LABRPR NON-REACTIVE 02/15/2020  ? LABRPR NON-REACTIVE 07/07/2018  ? LABRPR NON-REACTIVE 01/25/2018  ? LABRPR NON-REACTIVE 11/18/2017  ? ? ?STI Results GC CT  ?02/15/2020 ?10:53 AM Negative   Negative    ?07/07/2018 ?12:00 AM Negative   Negative    ?01/25/2018 ?12:00 AM Negative   Negative    ?12/13/2017 ?12:00 AM Negative    ?  Negative   Negative    ? Negative    ?09/06/2014 ?12:00 AM Negative   Negative    ?05/16/2014 ?12:00 AM NG: Negative   CT: Negative    ? ? ?Hepatitis B ?Lab Results  ?Component Value Date  ? HEPBSAB NO 04/26/2006  ? HEPBSAG NO 04/26/2006  ? ?Hepatitis C ?No results found for: [place], HCVRNAPCRQN ?Hepatitis A ?No results found for: HAV ?Lipids: ?Lab Results  ?Component Value Date  ? CHOL 177 05/05/2021  ? TRIG 93 05/05/2021  ? HDL 73 05/05/2021  ? CHOLHDL 2.4 05/05/2021  ? VLDL 13 09/06/2014  ? [place] 85 05/05/2021  ? ? ?Current HIV Regimen: ?Biktarvy ? ?TARGET DATE: The 27th of the month ? ?Assessment: ?[name] presents today for their first initiation injection for Cabenuva. Counseled that [name] is two separate intramuscular injections in the gluteal muscle on each side for each visit. Explained that the second  injection is 30 days after the initial injection then every 2 months thereafter. Discussed the need for viral load monitoring every 2 months for the first 6 months and then periodically afterwards as their provider sees the need. Discussed the rare but significant chance of developing resistance despite compliance. Explained that showing up to injection appointments is very important and warned that if 2 appointments are missed, it will be reassessed by their provider whether they are a good candidate for injection therapy. Counseled on possible side effects associated with the injections such as injection site pain, which is usually mild to moderate in nature, injection site nodules, and injection site reactions. Asked to call the clinic or send me a mychart message if they experience any issues, such as fatigue, nausea, headache, rash, or dizziness. Advised that they can take ibuprofen or tylenol for injection site pain if needed.  ? ?Administered cabotegravir 663m/3mL in left upper outer quadrant of the gluteal muscle. Administered rilpivirine 900 mg/316min the right upper outer quadrant of the gluteal muscle. Monitored patient for 10 minutes after injection. Injections were tolerated well without issue. Counseled to stop taking Biktarvy after today's dose and to call with any issues that may arise. Will make follow up appointments for second initiation injection in 30 days and then maintenance injections every 2 months thereafter for 6 months.  ? ?Patient's partner is on Ca[place] he is familiar with the process. He is due for Prevnar 20 and will administer this today. He has not yet received the Hep A vaccine nor had a Hep A antibody. Since he is not getting labs today, will get Hep A antibody at next visit to assess need for vaccination. Will also check Hep B antibody at next visit to check immunity after being vaccinated in 2018. ? ?Plan: ?- Stop Biktarvy ?- First Cabenuva injections administered ?- Second  initiation injection scheduled for 06/24/21 with Cassie ?- Hep A antibody and Hep B antibody at next visit ?- Maintenance injections scheduled for 08/22/21 with AmEstill Bamberg- Call with any issues or questions ? ?RaJoseph ArtPharm.D. ?PGY-1 Pharmacy Resident ?05/26/2021 12:08 PM ?

## 2021-06-18 ENCOUNTER — Other Ambulatory Visit: Payer: Self-pay | Admitting: Infectious Diseases

## 2021-06-18 DIAGNOSIS — B20 Human immunodeficiency virus [HIV] disease: Secondary | ICD-10-CM

## 2021-06-24 ENCOUNTER — Other Ambulatory Visit: Payer: Self-pay

## 2021-06-24 ENCOUNTER — Telehealth: Payer: Self-pay

## 2021-06-24 ENCOUNTER — Ambulatory Visit (INDEPENDENT_AMBULATORY_CARE_PROVIDER_SITE_OTHER): Payer: Self-pay | Admitting: Pharmacist

## 2021-06-24 DIAGNOSIS — Z23 Encounter for immunization: Secondary | ICD-10-CM

## 2021-06-24 DIAGNOSIS — B20 Human immunodeficiency virus [HIV] disease: Secondary | ICD-10-CM

## 2021-06-24 MED ORDER — CABOTEGRAVIR & RILPIVIRINE ER 600 & 900 MG/3ML IM SUER
1.0000 | Freq: Once | INTRAMUSCULAR | Status: AC
  Administered 2021-06-24: 1 via INTRAMUSCULAR

## 2021-06-24 NOTE — Telephone Encounter (Signed)
[facility] Patient Advocate Encounter ? ?Patient's medication (cabenuva) have been couriered to [facility] from [employer] and will be administered on the patient next office visit on 06/24/21. ? ?Clearance Coots , CPhT ?Specialty Pharmacy Patient Advocate ?[facility] ?Phone: [phone] ?Fax:  [phone]  ?

## 2021-06-24 NOTE — Progress Notes (Signed)
? ?HPI: Clayton Erickson is a 41 y.o. male who presents to the [facility] clinic for [place] administration. ? ?Patient Active Problem List  ? Diagnosis Date Noted  ? Bilateral hand pain 05/05/2021  ? Knee pain, bilateral 02/12/2019  ? Low vitamin D level 12/15/2017  ? Essential hypertension 08/10/2008  ? Anxiety state 11/18/2007  ? History of syphilis 10/28/2007  ? Human immunodeficiency virus (HIV) disease ([place]) 03/10/2006  ? ? ?Patient's Medications  ?New Prescriptions  ? No medications on file  ?Previous Medications  ? ATENOLOL (TENORMIN) 50 MG TABLET    Take 1 tablet (50 mg total) by mouth daily.  ? CABOTEGRAVIR & RILPIVIRINE ER (CABENUVA) 600 & 900 MG/3ML INJECTION    Inject 1 kit into the muscle every 8 (eight) weeks.  ? CHLORHEXIDINE (PERIDEX) 0.12 % SOLUTION    Use as directed 15 mLs in the mouth or throat 2 (two) times daily.  ? GABAPENTIN (NEURONTIN) 100 MG CAPSULE    Take 1 capsule (100 mg total) by mouth 3 (three) times daily.  ? VITAMIN D, ERGOCALCIFEROL, (DRISDOL) 1.25 MG (50000 UT) CAPS CAPSULE    Take 1 capsule (50,000 Units total) by mouth every 7 (seven) days.  ?Modified Medications  ? No medications on file  ?Discontinued Medications  ? No medications on file  ? ? ?Allergies: ?No Known Allergies ? ?Past Medical History: ?Past Medical History:  ?Diagnosis Date  ? Boils   ? Decreased thyroid stimulating hormone (TSH) level 01/2019  ? HIV infection ([place])   ? Hypertension   ? Low vitamin D level   ? Vitamin D deficiency 01/2019  ? ? ?Social History: ?Social History  ? ?Socioeconomic History  ? Marital status: Single  ?  Spouse name: Not on file  ? Number of children: Not on file  ? Years of education: Not on file  ? Highest education level: Not on file  ?Occupational History  ? Not on file  ?Tobacco Use  ? Smoking status: Some Days  ?  Types: Cigarettes  ? Smokeless tobacco: Never  ?Vaping Use  ? Vaping Use: Not on file  ?Substance and Sexual Activity  ? Alcohol use: Yes  ?  Comment: occ  ? Drug  use: Yes  ?  Frequency: 30.0 times per week  ?  Types: Marijuana  ? Sexual activity: Yes  ?  Partners: Male  ?  Comment: declined condoms  ?Other Topics Concern  ? Not on file  ?Social History Narrative  ? Not on file  ? ?Social Determinants of Health  ? ?Financial Resource Strain: Not on file  ?Food Insecurity: Not on file  ?Transportation Needs: Not on file  ?Physical Activity: Not on file  ?Stress: Not on file  ?Social Connections: Not on file  ? ? ?Labs: ?Lab Results  ?Component Value Date  ? HIV1RNAQUANT Not Detected 05/05/2021  ? HIV1RNAQUANT Not Detected 10/31/2020  ? HIV1RNAQUANT <20 DETECTED 02/15/2020  ? CD4TABS 1,306 10/31/2020  ? CD4TABS 1,150 02/15/2020  ? CD4TABS 1,608 09/20/2019  ? ? ?RPR and STI ?Lab Results  ?Component Value Date  ? LABRPR NON-REACTIVE 10/31/2020  ? LABRPR NON-REACTIVE 02/15/2020  ? LABRPR NON-REACTIVE 07/07/2018  ? LABRPR NON-REACTIVE 01/25/2018  ? LABRPR NON-REACTIVE 11/18/2017  ? ? ?STI Results GC CT  ?02/15/2020 ?10:53 Clayton Negative   Negative    ?07/07/2018 ?12:00 Clayton Negative   Negative    ?01/25/2018 ?12:00 Clayton Negative   Negative    ?12/13/2017 ?12:00 Clayton Negative    ?  Negative   Negative    ? Negative    ?09/06/2014 ?12:00 Clayton Negative   Negative    ?05/16/2014 ?12:00 Clayton NG: Negative   CT: Negative    ? ? ?Hepatitis B ?Lab Results  ?Component Value Date  ? HEPBSAB NO 04/26/2006  ? HEPBSAG NO 04/26/2006  ? ?Hepatitis C ?No results found for: [place], HCVRNAPCRQN ?Hepatitis A ?No results found for: HAV ?Lipids: ?Lab Results  ?Component Value Date  ? CHOL 177 05/05/2021  ? TRIG 93 05/05/2021  ? HDL 73 05/05/2021  ? CHOLHDL 2.4 05/05/2021  ? VLDL 13 09/06/2014  ? [place] 85 05/05/2021  ? ? ?TARGET DATE: ?The 27th of the month ? ?Assessment: ?Clayton Erickson presents today for their maintenance Cabenuva injections. Past injections were tolerated well without issues. No problems with systemic side effects of injections. No new partners since last injection; politely declined STI testing.   ? ?Administered cabotegravir 634m/3mL in left upper outer quadrant of the gluteal muscle. Administered rilpivirine 900 mg/325min the right upper outer quadrant of the gluteal muscle. Injections were tolerated well. Patient will follow up in 2 months for next set of injections. ? ?Plan: ?- Cabenuva injections administered ?- Check HIV RNA, Hepatitis A antibody, and Hepatitis B antibody today ?- Next injections scheduled for 6/23 with AmEstill Bambergand 8/22 with StColletta Erickson?- Call with any issues or questions ? ? ?AmVance PeperPharmD ?PGY1 Pharmacy Resident ?06/24/2021 12:25 PM  ?

## 2021-06-27 LAB — HEPATITIS A ANTIBODY, TOTAL: Hepatitis A AB,Total: REACTIVE — AB

## 2021-06-27 LAB — HEPATITIS B SURFACE ANTIBODY,QUALITATIVE: Hep B S Ab: REACTIVE — AB

## 2021-06-27 LAB — HIV-1 RNA QUANT-NO REFLEX-BLD
HIV 1 RNA Quant: NOT DETECTED copies/mL
HIV-1 RNA Quant, Log: NOT DETECTED Log copies/mL

## 2021-06-27 LAB — HEPATITIS B SURFACE ANTIGEN: Hepatitis B Surface Ag: NONREACTIVE

## 2021-08-19 ENCOUNTER — Telehealth: Payer: Self-pay

## 2021-08-19 NOTE — Telephone Encounter (Signed)
[facility] Patient Advocate Encounter  Patient's medication Renaldo Harrison) have been couriered to [facility] from [employer] and will be administered on the patient next office visit on 08/22/21.  Clearance Coots , CPhT Specialty Pharmacy Patient [facility] Phone: [phone] Fax:  [phone]

## 2021-08-22 ENCOUNTER — Ambulatory Visit (INDEPENDENT_AMBULATORY_CARE_PROVIDER_SITE_OTHER): Payer: Self-pay | Admitting: Pharmacist

## 2021-08-22 ENCOUNTER — Other Ambulatory Visit: Payer: Self-pay

## 2021-08-22 DIAGNOSIS — B20 Human immunodeficiency virus [HIV] disease: Secondary | ICD-10-CM

## 2021-08-22 MED ORDER — CABOTEGRAVIR & RILPIVIRINE ER 600 & 900 MG/3ML IM SUER
1.0000 | Freq: Once | INTRAMUSCULAR | Status: AC
  Administered 2021-08-22: 1 via INTRAMUSCULAR

## 2021-08-24 ENCOUNTER — Emergency Department: Payer: Self-pay

## 2021-08-24 ENCOUNTER — Other Ambulatory Visit: Payer: Self-pay

## 2021-08-24 ENCOUNTER — Emergency Department
Admission: EM | Admit: 2021-08-24 | Discharge: 2021-08-24 | Disposition: A | Discharge status: Home / Self Care | Payer: Self-pay | Attending: Student in an Organized Health Care Education/Training Program | Admitting: Student in an Organized Health Care Education/Training Program

## 2021-08-24 DIAGNOSIS — S61411A Laceration without foreign body of right hand, initial encounter: Secondary | ICD-10-CM | POA: Insufficient documentation

## 2021-08-24 DIAGNOSIS — W540XXA Bitten by dog, initial encounter: Secondary | ICD-10-CM | POA: Insufficient documentation

## 2021-08-24 DIAGNOSIS — S61419A Laceration without foreign body of unspecified hand, initial encounter: Secondary | ICD-10-CM

## 2021-08-24 DIAGNOSIS — S61412A Laceration without foreign body of left hand, initial encounter: Secondary | ICD-10-CM | POA: Insufficient documentation

## 2021-08-24 DIAGNOSIS — S51812A Laceration without foreign body of left forearm, initial encounter: Secondary | ICD-10-CM | POA: Insufficient documentation

## 2021-08-24 DIAGNOSIS — S81811A Laceration without foreign body, right lower leg, initial encounter: Secondary | ICD-10-CM | POA: Insufficient documentation

## 2021-08-24 MED ORDER — AMOXICILLIN-POT CLAVULANATE 875-125 MG PO TABS
1.0000 | ORAL_TABLET | Freq: Once | ORAL | Status: AC
  Administered 2021-08-24: 1 via ORAL
  Filled 2021-08-24: qty 1

## 2021-08-24 MED ORDER — ONDANSETRON HCL 4 MG/2ML IJ SOLN
4.0000 mg | Freq: Once | INTRAMUSCULAR | Status: AC
  Administered 2021-08-24: 4 mg via INTRAVENOUS
  Filled 2021-08-24: qty 2

## 2021-08-24 MED ORDER — OXYCODONE-ACETAMINOPHEN 5-325 MG PO TABS: 1.0000 | ORAL_TABLET | ORAL | 0 refills | Status: DC | PRN

## 2021-08-24 MED ORDER — LIDOCAINE-EPINEPHRINE 2 %-1:100000 IJ SOLN
20.0000 mL | Freq: Once | INTRAMUSCULAR | Status: AC
  Administered 2021-08-24: 20 mL via INTRADERMAL
  Filled 2021-08-24: qty 1

## 2021-08-24 MED ORDER — MORPHINE SULFATE (PF) 4 MG/ML IV SOLN
4.0000 mg | INTRAVENOUS | Status: DC | PRN
  Administered 2021-08-24: 4 mg via INTRAVENOUS
  Filled 2021-08-24: qty 1

## 2021-08-24 MED ORDER — AMOXICILLIN-POT CLAVULANATE 500-125 MG PO TABS: 1.0000 | ORAL_TABLET | Freq: Two times a day (BID) | ORAL | 0 refills | Status: AC

## 2021-08-24 MED ORDER — TRIPLE ANTIBIOTIC 3.5-400-5000 EX OINT
1.0000 | TOPICAL_OINTMENT | Freq: Once | CUTANEOUS | Status: AC
  Administered 2021-08-24: 1 via CUTANEOUS
  Filled 2021-08-24: qty 1

## 2021-08-24 MED ORDER — LIDOCAINE-EPINEPHRINE 2 %-1:100000 IJ SOLN
20.0000 mL | Freq: Once | INTRAMUSCULAR | Status: AC
  Administered 2021-08-24: 1 mL via INTRADERMAL
  Filled 2021-08-24: qty 1

## 2021-08-24 NOTE — ED Notes (Signed)
Dr. Roxan Hockey at bedside suturing.

## 2021-08-24 NOTE — ED Triage Notes (Signed)
Pt arrives today for dog bite. Pt was bit by their own dog today, puncture wounds to LARM, RLE, R Hand. Pt unsure if dog is up to date on vaccines. Last Tdap was this year.

## 2021-08-26 LAB — HIV-1 RNA QUANT-NO REFLEX-BLD
HIV 1 RNA Quant: NOT DETECTED Copies/mL
HIV-1 RNA Quant, Log: NOT DETECTED Log cps/mL

## 2021-08-28 ENCOUNTER — Telehealth: Payer: Self-pay

## 2021-08-28 NOTE — Telephone Encounter (Signed)
Patient called stating he has been having chest pain for few days and tingling on his left side. Patient has been advised to go to the ED to be evaluated. Patient stated he will have his roommate take him soon.  Idus Rathke T Pricilla Loveless

## 2021-08-28 NOTE — Telephone Encounter (Signed)
Agree with ER referral to rule out cardiac disease.  Thank you

## 2021-08-29 ENCOUNTER — Ambulatory Visit: Payer: Self-pay | Admitting: Infectious Diseases

## 2021-09-02 ENCOUNTER — Other Ambulatory Visit: Payer: Self-pay

## 2021-09-02 ENCOUNTER — Emergency Department
Admission: EM | Admit: 2021-09-02 | Discharge: 2021-09-02 | Disposition: A | Discharge status: Home / Self Care | Payer: Self-pay | Attending: Emergency Medicine | Admitting: Emergency Medicine

## 2021-09-02 ENCOUNTER — Encounter: Payer: Self-pay | Admitting: Emergency Medicine

## 2021-09-02 DIAGNOSIS — L089 Local infection of the skin and subcutaneous tissue, unspecified: Secondary | ICD-10-CM

## 2021-09-02 DIAGNOSIS — I10 Essential (primary) hypertension: Secondary | ICD-10-CM | POA: Insufficient documentation

## 2021-09-02 DIAGNOSIS — Z4802 Encounter for removal of sutures: Secondary | ICD-10-CM | POA: Insufficient documentation

## 2021-09-02 MED ORDER — SULFAMETHOXAZOLE-TRIMETHOPRIM 800-160 MG PO TABS: 1.0000 | ORAL_TABLET | Freq: Two times a day (BID) | ORAL | 0 refills | Status: DC

## 2021-09-02 MED ORDER — BACITRACIN ZINC 500 UNIT/GM EX OINT
TOPICAL_OINTMENT | Freq: Once | CUTANEOUS | Status: AC
  Administered 2021-09-02: 1 via TOPICAL
  Filled 2021-09-02: qty 0.9

## 2021-09-02 NOTE — Discharge Instructions (Signed)
Please follow-up with your primary care provider if you do not see any improvement in your wound over the next 2 to 3 days.  If you develop fever or symptoms change or worsen and you are unable to see your primary care provider, please return to the emergency department.

## 2021-09-02 NOTE — ED Triage Notes (Signed)
Pt here to have his stiches removed. Pt does not have any complaints at this time.

## 2021-09-02 NOTE — ED Provider Notes (Signed)
[facility] Provider Note    Event Date/Time   First MD Initiated Contact with Patient 09/02/21 [phone]     (approximate)   History   Suture / Staple Removal   HPI  Clayton Erickson is a 41 y.o. male presents to the emergency department for suture removal.  He was bitten by a dog 08/24/2021 and wounds required suturing.  All wounds have healed fairly well with the exception of the largest on his left forearm.  This area is very tender and red with some drainage.  He denies fever.  Past Medical History:  Diagnosis Date   Boils    Decreased thyroid stimulating hormone (TSH) level 01/2019   HIV infection ([facility])    Hypertension    Low vitamin D level    Vitamin D deficiency 01/2019     Physical Exam   Triage Vital Signs: ED Triage Vitals  Enc Vitals Group     BP 09/02/21 0956 (!) 158/90     Pulse Rate 09/02/21 0956 88     Resp 09/02/21 0956 16     Temp 09/02/21 0956 98.6 F (37 C)     Temp Source 09/02/21 0956 Oral     SpO2 09/02/21 0956 100 %     Weight 09/02/21 0956 189 lb 15.9 oz (86.2 kg)     Height 09/02/21 0956 5\' 11"  (1.803 m)     Head Circumference --      Peak Flow --      Pain Score 09/02/21 0949 0     Pain Loc --      Pain Edu? --      Excl. in GC? --     Most recent vital signs: Vitals:   09/02/21 0956  BP: (!) 158/90  Pulse: 88  Resp: 16  Temp: 98.6 F (37 C)  SpO2: 100%    General: Awake, no distress.  CV:  Good peripheral perfusion.  Resp:  Normal effort.  Abd:  No distention.  Other:  Wound to the left forearm with mild surrounding erythema and purulent drainage.  2 sutures appear as if they have dehisced.   ED Results / Procedures / Treatments   Labs (all labs ordered are listed, but only abnormal results are displayed) Labs Reviewed - No data to display   EKG     RADIOLOGY  Not indicated  I have independently reviewed and interpreted imaging as well as reviewed report from  radiology.  PROCEDURES:  Critical Care performed: No  Procedures   MEDICATIONS ORDERED IN ED:  Medications  bacitracin ointment (1 Application Topical Given 09/02/21 1042)     IMPRESSION / MDM / ASSESSMENT AND PLAN / ED COURSE   I reviewed the triage vital signs and the nursing notes.  Differential diagnosis includes, but is not limited to: Suture removal, wound infection, cellulitis  Patient's presentation is most consistent with acute, uncomplicated illness.  41 year old male presenting to the emergency department for treatment and evaluation of suture removal.  See HPI for further details.  There is some concern for wound infection to the upper area of the left forearm.  Sutures were removed by me.  There were a total of 14 sutures removed from the left wrist, forearm, and lower extremity.     FINAL CLINICAL IMPRESSION(S) / ED DIAGNOSES   Final diagnoses:  Visit for suture removal  Acute post-traumatic wound infection, initial encounter     Rx / DC Orders   ED Discharge Orders  Ordered    sulfamethoxazole-trimethoprim (BACTRIM DS) 800-160 MG tablet  2 times daily        09/02/21 1026             Note:  This document was prepared using Dragon voice recognition software and may include unintentional dictation errors.   Chinita Pester, FNP 09/03/21 1529    Phineas Semen, MD 09/03/21 [phone]

## 2021-09-09 ENCOUNTER — Other Ambulatory Visit: Payer: Self-pay

## 2021-09-09 ENCOUNTER — Ambulatory Visit: Payer: Self-pay

## 2021-09-25 ENCOUNTER — Ambulatory Visit: Payer: Self-pay | Admitting: Infectious Diseases

## 2021-10-16 ENCOUNTER — Telehealth: Payer: Self-pay

## 2021-10-16 NOTE — Telephone Encounter (Signed)
[facility] Patient Advocate Encounter  Patient's medications have been couriered to [facility] from [facility]: [phone], and will be administered on  10/21/2021.

## 2021-10-21 ENCOUNTER — Ambulatory Visit (INDEPENDENT_AMBULATORY_CARE_PROVIDER_SITE_OTHER): Payer: Self-pay | Admitting: Infectious Diseases

## 2021-10-21 ENCOUNTER — Other Ambulatory Visit: Payer: Self-pay

## 2021-10-21 ENCOUNTER — Encounter: Payer: Self-pay | Admitting: Infectious Diseases

## 2021-10-21 DIAGNOSIS — I10 Essential (primary) hypertension: Secondary | ICD-10-CM

## 2021-10-21 DIAGNOSIS — B20 Human immunodeficiency virus [HIV] disease: Secondary | ICD-10-CM

## 2021-10-21 DIAGNOSIS — R454 Irritability and anger: Secondary | ICD-10-CM | POA: Insufficient documentation

## 2021-10-21 MED ORDER — HYDROXYZINE HCL 25 MG PO TABS: 25.0000 mg | ORAL_TABLET | Freq: Three times a day (TID) | ORAL | 2 refills | Status: DC | PRN

## 2021-10-21 MED ORDER — BUSPIRONE HCL 5 MG PO TABS: 5.0000 mg | ORAL_TABLET | Freq: Three times a day (TID) | ORAL | 2 refills | Status: DC | PRN

## 2021-10-21 MED ORDER — CABOTEGRAVIR & RILPIVIRINE ER 600 & 900 MG/3ML IM SUER
1.0000 | Freq: Once | INTRAMUSCULAR | Status: AC
  Administered 2021-10-21: 1 via INTRAMUSCULAR

## 2021-10-21 MED ORDER — AMLODIPINE BESYLATE 5 MG PO TABS: 5.0000 mg | ORAL_TABLET | Freq: Every day | ORAL | 5 refills | Status: DC

## 2021-10-21 NOTE — Assessment & Plan Note (Signed)
Feels like this has become worse since passing of his mother. He does not sound to fit bipolar criteria to me, more irritable mood.  We discussed possible interventions - he is open to counseling and I provided him with online resource as well as with our team here.   Will start Buspar for PRN anxiety management and look to initiation of Effexor 37.5 mg daily to see if this helps.

## 2021-10-21 NOTE — Progress Notes (Signed)
Name: Clayton Erickson  DOB: May 27, 1980 MRN: [id number] PCP: Rosaria Ferries, MD     Brief Narrative:  Clayton Erickson is a 41 y.o. man with HIV infection that was originally diagnosed in 2002. His CD4 nadir is "above 200". History of OIs: none.  HIV Risk: MSM.  Previously in care at [facility] [place] [phone]  Previous Regimens: Complera 2016 >> suppressed  Odefsey 2018 (poor adherence) Biktarvy 11-2018   Genotypes: 10-2017 >> no RT, PI or INSTI resistance   Subjective:   Chief Complaint  Patient presents with   Follow-up      HPI:  Switched over to cabenuva and finds this very convenient. Only has some stinging at the time of administration then it goes away.   Has noticed that several family members have commented on his mood / irritability and feeling like he can go off the handle at them. 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  He does not really feel like he is depressed but more anxious and requesting something to help for this.    Review of Systems  Constitutional:  Negative for chills, fever, malaise/fatigue and weight loss.  HENT:  Negative for sore throat.   Respiratory:  Negative for cough, sputum production and shortness of breath.   Cardiovascular: Negative.   Gastrointestinal:  Negative for abdominal pain, diarrhea and vomiting.  Musculoskeletal:  Negative for joint pain, myalgias and neck pain.  Skin:  Negative for rash.  Neurological:  Negative for headaches.  Psychiatric/Behavioral:  Negative for depression and substance abuse. The patient is nervous/anxious and has insomnia.     Past Medical History:  Diagnosis Date   Boils    Decreased thyroid stimulating hormone (TSH) level 01/2019   HIV infection ([place])    Hypertension    Low vitamin D level    Vitamin D deficiency 01/2019    Outpatient Medications Prior to Visit  Medication Sig Dispense  Refill   cabotegravir & rilpivirine ER (CABENUVA) 600 & 900 MG/3ML injection Inject 1 kit into the muscle every 8 (eight) weeks. 6 mL 11   atenolol (TENORMIN) 50 MG tablet Take 1 tablet (50 mg total) by mouth daily. 30 tablet 3   oxyCODONE-acetaminophen (PERCOCET) 5-325 MG tablet Take 1 tablet by mouth every 4 (four) hours as needed for severe pain. 10 tablet 0   sulfamethoxazole-trimethoprim (BACTRIM DS) 800-160 MG tablet Take 1 tablet by mouth 2 (two) times daily. 20 tablet 0   chlorhexidine (PERIDEX) 0.12 % solution Use as directed 15 mLs in the mouth or throat 2 (two) times daily. (Patient not taking: Reported on 05/05/2021) 473 mL 0   gabapentin (NEURONTIN) 100 MG capsule Take 1 capsule (100 mg total) by mouth 3 (three) times daily. (Patient not taking: Reported on 10/31/2020) 90 capsule 2   Vitamin D, Ergocalciferol, (DRISDOL) 1.25 MG (50000 UT) CAPS capsule Take 1 capsule (50,000 Units total) by mouth every 7 (seven) days. (Patient not taking: Reported on 10/31/2020) 5 capsule 6   No facility-administered medications prior to visit.    No Known Allergies   Social History   Substance and Sexual Activity  Sexual Activity Yes   Partners: Male   Comment: declined condoms    Objective:   Vitals:   10/21/21 1425  BP: (!) 159/93  Pulse: 83  Temp: 97.8 F (36.6 C)  TempSrc: Oral  SpO2: 96%  Weight: 197 lb (89.4 kg)  Body mass index is 27.48 kg/m.   Physical Exam Constitutional:      Appearance: Normal appearance. He is not ill-appearing.  HENT:     Head: Normocephalic.     Mouth/Throat:     Mouth: Mucous membranes are moist.     Pharynx: Oropharynx is clear.  Eyes:     General: No scleral icterus. Pulmonary:     Effort: Pulmonary effort is normal.     Breath sounds: Normal breath sounds.  Abdominal:     General: Abdomen is flat.     Palpations: Abdomen is soft.  Musculoskeletal:        General: Normal range of motion.     Cervical back: Normal range of motion.   Skin:    Coloration: Skin is not jaundiced or pale.  Neurological:     Mental Status: He is alert and oriented to person, place, and time.  Psychiatric:        Mood and Affect: Mood normal.        Judgment: Judgment normal.      Lab Results Lab Results  Component Value Date   WBC 9.8 05/05/2021   HGB 14.8 05/05/2021   HCT 44.5 05/05/2021   MCV 82.6 05/05/2021   PLT 253 05/05/2021    Lab Results  Component Value Date   CREATININE 1.09 05/05/2021   BUN 13 05/05/2021   NA 135 05/05/2021   K 4.3 05/05/2021   CL 98 05/05/2021   CO2 27 05/05/2021    Lab Results  Component Value Date   ALT 14 05/05/2021   AST 23 05/05/2021   ALKPHOS 55 09/06/2014   BILITOT 0.7 05/05/2021    Lab Results  Component Value Date   CHOL 177 05/05/2021   HDL 73 05/05/2021   LDLCALC 85 05/05/2021   TRIG 93 05/05/2021   CHOLHDL 2.4 05/05/2021   HIV 1 RNA Quant  Date Value  08/22/2021 Not Detected Copies/mL  06/24/2021 NOT DETECTED copies/mL  05/05/2021 Not Detected Copies/mL   CD4 T Cell Abs (/uL)  Date Value  10/31/2020 1,306  02/15/2020 1,150  09/20/2019 1,608     Assessment & Plan:   Problem List Items Addressed This Visit       High   Human immunodeficiency virus (HIV) disease ([place]) (Chronic)    Doing well on q29minjections of Cabenuva. Tolerating well with only minor injection site reactions. All injections have been administered at appropriate treatment interval.   Last VL result:  HIV 1 RNA Quant (Copies/mL)  Date Value  08/22/2021 Not Detected   TD: 27th  Next Appointment: 12/25/2021  Will move to q440miral load checks.  Flu shot at return visit.          Unprioritized   Irritability    Feels like this has become worse since passing of his mother. He does not sound to fit bipolar criteria to me, more irritable mood.  We discussed possible interventions - he is open to counseling and I provided him with online resource as well as with our team here.    Will start Buspar for PRN anxiety management and look to initiation of Effexor 37.5 mg daily to see if this helps.       Essential hypertension    Blood pressure still above control. He has been very reliable to take this for at least a month.   Will STOP the atenolol and switch to 5 mg amlodipine daily. Can move to 10 mg dose if needed or  add olmesartan in combo pill.       Relevant Medications   amLODipine (NORVASC) 5 MG tablet   Janene Madeira, MSN, NP-C [facility] for [facility]ious Disease [email or web address] Pager: [phone] Office: [place]: [phone]    10/21/21  3:03 PM

## 2021-10-21 NOTE — Assessment & Plan Note (Signed)
Blood pressure still above control. He has been very reliable to take this for at least a month.   Will STOP the atenolol and switch to 5 mg amlodipine daily. Can move to 10 mg dose if needed or add olmesartan in combo pill.

## 2021-10-21 NOTE — Addendum Note (Signed)
Addended by: Juanita Laster on: 10/21/2021 03:06 PM   Modules accepted: Orders

## 2021-10-21 NOTE — Patient Instructions (Addendum)
  Please call Claris Che @ [phone] to schedule a dental appointment    START the Buspar for the anxiety - can do this up to 3 times a day.   STOP the atenolol  START the Amlodipine - this is the new blood pressure medication.  1 tablet once a day.    For the irritability - we can consider one of the following to see if this may help:  They are all taken daily.   Effexor *  Lexapro   Celexa    Can call back to schedule a visit with our   [email or web address] --> tele / online counseling

## 2021-10-21 NOTE — Assessment & Plan Note (Addendum)
Doing well on q48m injections of Cabenuva. Tolerating well with only minor injection site reactions. All injections have been administered at appropriate treatment interval.   Last VL result:  HIV 1 RNA Quant (Copies/mL)  Date Value  08/22/2021 Not Detected    TD: 27th  Next Appointment: 12/25/2021  Will move to q34m viral load checks.  Flu shot at return visit.

## 2021-12-18 ENCOUNTER — Telehealth: Payer: Self-pay

## 2021-12-18 NOTE — Telephone Encounter (Signed)
[facility] Patient Advocate Encounter  Patient's medications Kern Reap) have been couriered to [facility] from [place]: [phone], and will be administered on 12/25/2021.

## 2021-12-25 ENCOUNTER — Ambulatory Visit (INDEPENDENT_AMBULATORY_CARE_PROVIDER_SITE_OTHER): Payer: Self-pay | Admitting: Pharmacist

## 2021-12-25 ENCOUNTER — Other Ambulatory Visit: Payer: Self-pay

## 2021-12-25 ENCOUNTER — Ambulatory Visit (INDEPENDENT_AMBULATORY_CARE_PROVIDER_SITE_OTHER): Payer: Self-pay

## 2021-12-25 DIAGNOSIS — B20 Human immunodeficiency virus [HIV] disease: Secondary | ICD-10-CM

## 2021-12-25 DIAGNOSIS — Z23 Encounter for immunization: Secondary | ICD-10-CM

## 2021-12-25 MED ORDER — CABOTEGRAVIR & RILPIVIRINE ER 600 & 900 MG/3ML IM SUER
1.0000 | Freq: Once | INTRAMUSCULAR | Status: AC
  Administered 2021-12-25: 1 via INTRAMUSCULAR

## 2021-12-25 MED ORDER — CABOTEGRAVIR & RILPIVIRINE ER 600 & 900 MG/3ML IM SUER: 1.0000 | Freq: Once | INTRAMUSCULAR | Status: DC

## 2021-12-25 NOTE — Progress Notes (Signed)
 HPI: Clayton Erickson is a 41 y.o. male who presents to the [facility] pharmacy clinic for Cabenuva administration.  Patient Active Problem List   Diagnosis Date Noted   Irritability 10/21/2021   Bilateral hand pain 05/05/2021   Knee pain, bilateral 02/12/2019   Low vitamin D level 12/15/2017   Essential hypertension 08/10/2008   Anxiety state 11/18/2007   History of syphilis 10/28/2007   Human immunodeficiency virus (HIV) Erickson (HCC) 03/10/2006    Patient's Medications  New Prescriptions   No medications on file  Previous Medications   AMLODIPINE (NORVASC) 5 MG TABLET    Take 1 tablet (5 mg total) by mouth daily.   BUSPIRONE (BUSPAR) 5 MG TABLET    Take 1 tablet (5 mg total) by mouth 3 (three) times daily as needed.   CABOTEGRAVIR & RILPIVIRINE ER (CABENUVA) 600 & 900 MG/3ML INJECTION    Inject 1 kit into the muscle every 8 (eight) weeks.   CHLORHEXIDINE (PERIDEX) 0.12 % SOLUTION    Use as directed 15 mLs in the mouth or throat 2 (two) times daily.   GABAPENTIN (NEURONTIN) 100 MG CAPSULE    Take 1 capsule (100 mg total) by mouth 3 (three) times daily.  Modified Medications   No medications on file  Discontinued Medications   No medications on file    Allergies: No Known Allergies  Past Medical History: Past Medical History:  Diagnosis Date   Boils    Decreased thyroid stimulating hormone (TSH) level 01/2019   HIV infection (HCC)    Hypertension    Low vitamin D level    Vitamin D deficiency 01/2019    Social History: Social History   Socioeconomic History   Marital status: Single    Spouse name: Not on file   Number of children: Not on file   Years of education: Not on file   Highest education level: Not on file  Occupational History   Not on file  Tobacco Use   Smoking status: Some Days    Types: Cigarettes   Smokeless tobacco: Never  Vaping Use   Vaping Use: Not on file  Substance and Sexual Activity   Alcohol use: Yes    Comment: occ   Drug use: Yes     Frequency: 30.0 times per week    Types: Marijuana   Sexual activity: Yes    Partners: Male    Comment: declined condoms  Other Topics Concern   Not on file  Social History Narrative   Not on file   Social Determinants of Health   Financial Resource Strain: Not on file  Food Insecurity: Not on file  Transportation Needs: Not on file  Physical Activity: Not on file  Stress: Not on file  Social Connections: Not on file    Labs: Lab Results  Component Value Date   HIV1RNAQUANT Not Detected 08/22/2021   HIV1RNAQUANT NOT DETECTED 06/24/2021   HIV1RNAQUANT Not Detected 05/05/2021   CD4TABS 1,306 10/31/2020   CD4TABS 1,150 02/15/2020   CD4TABS 1,608 09/20/2019    RPR and STI Lab Results  Component Value Date   LABRPR NON-REACTIVE 10/31/2020   LABRPR NON-REACTIVE 02/15/2020   LABRPR NON-REACTIVE 07/07/2018   LABRPR NON-REACTIVE 01/25/2018   LABRPR NON-REACTIVE 11/18/2017    STI Results GC CT  02/15/2020 10:53 AM Negative  Negative   07/07/2018 12:00 AM Negative  Negative   01/25/2018 12:00 AM Negative  Negative   12/13/2017 12:00 AM Negative    Negative  Negative      Negative   09/06/2014 12:00 AM Negative  Negative   05/16/2014 12:00 AM NG: Negative  CT: Negative     Hepatitis B Lab Results  Component Value Date   HEPBSAB REACTIVE (A) 06/24/2021   HEPBSAG NON-REACTIVE 06/24/2021   Hepatitis C No results found for: "HEPCAB", "HCVRNAPCRQN" Hepatitis A Lab Results  Component Value Date   HAV REACTIVE (A) 06/24/2021   Lipids: Lab Results  Component Value Date   CHOL 177 05/05/2021   TRIG 93 05/05/2021   HDL 73 05/05/2021   CHOLHDL 2.4 05/05/2021   VLDL 13 09/06/2014   LDLCALC 85 05/05/2021    TARGET DATE: The 27th  Assessment: Clayton Erickson presents today for his maintenance Cabenuva injections. Past injections were tolerated well without issues. His blood pressure medication was changed at his last visit with Clayton Erickson in August. He was instructed to stop  atenolol and start amlodipine. He states today that he has been unable to pick up his amlodipine from the pharmacy as he lives in [name]. He also stopped taking his atenolol at last visit as well. Blood pressure reading today was 161/102. Advised him to please pick up his amlodipine today as his blood pressure was elevated. He will go straight to the pharmacy from this appointment. He will pick up Buspar at that time as well. He is also agreeable to getting the flu and COVID vaccines today. Will update HIV RNA today as well. He has no symptoms of any STIs and states that he has not had any new partners and politely declines screening today.   Administered cabotegravir 667m/3mL in left upper outer quadrant of the gluteal muscle. Administered rilpivirine 900 mg/386min the right upper outer quadrant of the gluteal muscle. No issues with injections. He will follow up in 2 months for next set of injections.  Plan: - Cabenuva injections administered - HIV RNA today - Flu and COVID vaccines administered - Next injections scheduled for 02/18/22 with Clayton Marylandnd 04/22/22 with Clayton Erickson - Call with any issues or questions  Clayton Custard L. Clayton Erickson, PharmD, BCIDP, AAHIVP, CPGilbertlinical Pharmacist Practitioner [facility]

## 2021-12-28 LAB — HIV-1 RNA QUANT-NO REFLEX-BLD
HIV 1 RNA Quant: NOT DETECTED Copies/mL
HIV-1 RNA Quant, Log: NOT DETECTED Log cps/mL

## 2022-02-11 ENCOUNTER — Telehealth: Payer: Self-pay

## 2022-02-11 NOTE — Telephone Encounter (Signed)
[facility] Patient Advocate Encounter  Patient's medications have been couriered to [facility] from [facility]: [phone], and will be administered on  02/18/2022.  

## 2022-02-18 ENCOUNTER — Encounter: Payer: Self-pay | Admitting: Infectious Diseases

## 2022-02-25 ENCOUNTER — Ambulatory Visit (INDEPENDENT_AMBULATORY_CARE_PROVIDER_SITE_OTHER): Payer: Self-pay | Admitting: Infectious Diseases

## 2022-02-25 ENCOUNTER — Encounter: Payer: Self-pay | Admitting: Infectious Diseases

## 2022-02-25 ENCOUNTER — Other Ambulatory Visit: Payer: Self-pay

## 2022-02-25 VITALS — BP 165/115 | HR 91 | Temp 97.8°F | Ht 71.0 in | Wt 201.0 lb

## 2022-02-25 DIAGNOSIS — I10 Essential (primary) hypertension: Secondary | ICD-10-CM

## 2022-02-25 DIAGNOSIS — B20 Human immunodeficiency virus [HIV] disease: Secondary | ICD-10-CM

## 2022-02-25 DIAGNOSIS — F411 Generalized anxiety disorder: Secondary | ICD-10-CM

## 2022-02-25 DIAGNOSIS — F17210 Nicotine dependence, cigarettes, uncomplicated: Secondary | ICD-10-CM

## 2022-02-25 DIAGNOSIS — F419 Anxiety disorder, unspecified: Secondary | ICD-10-CM

## 2022-02-25 DIAGNOSIS — I159 Secondary hypertension, unspecified: Secondary | ICD-10-CM

## 2022-02-25 MED ORDER — BUSPIRONE HCL 10 MG PO TABS: 10.0000 mg | ORAL_TABLET | Freq: Three times a day (TID) | ORAL | 2 refills | Status: DC | PRN

## 2022-02-25 MED ORDER — AMLODIPINE BESYLATE-VALSARTAN 10-160 MG PO TABS: 1.0000 | ORAL_TABLET | Freq: Every day | ORAL | 1 refills | Status: DC

## 2022-02-25 MED ORDER — CABOTEGRAVIR & RILPIVIRINE ER 600 & 900 MG/3ML IM SUER
1.0000 | Freq: Once | INTRAMUSCULAR | Status: AC
  Administered 2022-02-25: 1 via INTRAMUSCULAR

## 2022-02-25 NOTE — Progress Notes (Signed)
Name: Clayton Erickson  DOB: 1980/05/04 MRN: [id number] PCP: Rosaria Ferries, MD     Brief Narrative:  Clayton Erickson is a 41 y.o. man with HIV infection that was originally diagnosed in 2002. His CD4 nadir is "above 200". History of OIs: none.  HIV Risk: MSM.  Previously in care at [facility], [place] P#[phone]  Previous Regimens: Complera 2016 >> suppressed  Odefsey 2018 (poor adherence) Biktarvy 11-2018  Cabenuva   Genotypes: 10-2017 >> no RT, PI or INSTI resistance   Subjective:   Chief Complaint  Patient presents with   Follow-up    B20-Cabenuva      HPI:  Clayton Erickson continues to go well. No concerns and plans to continue this for treatment.  Got sick from the flu shot - does not want this again in the future   He takes the buspar for anxiety management as needed - seems that they help a little and wiling to increase the dose to see how this has been effective. Has had stress at home with finances and car problems.  He is looking into billing/coding career and is researching what he can.  He has urinary symptoms with noctural voiding and frequency. He is not sure why these occur but plans to try to decrease his water intake after the evenings to see if that helps. This does interrupt his sleep.    Review of Systems  Constitutional:  Negative for chills, fever, malaise/fatigue and weight loss.  HENT:  Negative for sore throat.   Respiratory:  Negative for cough, sputum production and shortness of breath.   Cardiovascular: Negative.   Gastrointestinal:  Negative for abdominal pain, diarrhea and vomiting.  Musculoskeletal:  Negative for joint pain, myalgias and neck pain.  Skin:  Negative for rash.  Neurological:  Negative for headaches.  Psychiatric/Behavioral:  Negative for depression and substance abuse. The patient is nervous/anxious and has insomnia.     Past Medical History:  Diagnosis Date   Boils    Decreased  thyroid stimulating hormone (TSH) level 01/2019   HIV infection ([place])    Hypertension    Low vitamin D level    Vitamin D deficiency 01/2019    Outpatient Medications Prior to Visit  Medication Sig Dispense Refill   cabotegravir & rilpivirine ER (CABENUVA) 600 & 900 MG/3ML injection Inject 1 kit into the muscle every 8 (eight) weeks. 6 mL 11   chlorhexidine (PERIDEX) 0.12 % solution Use as directed 15 mLs in the mouth or throat 2 (two) times daily. 473 mL 0   gabapentin (NEURONTIN) 100 MG capsule Take 1 capsule (100 mg total) by mouth 3 (three) times daily. 90 capsule 2   amLODipine (NORVASC) 5 MG tablet Take 1 tablet (5 mg total) by mouth daily. 30 tablet 5   busPIRone (BUSPAR) 5 MG tablet Take 1 tablet (5 mg total) by mouth 3 (three) times daily as needed. 60 tablet 2   No facility-administered medications prior to visit.    No Known Allergies   Social History   Substance and Sexual Activity  Sexual Activity Yes   Partners: Male   Comment: declined condoms    Objective:   Vitals:   02/25/22 0855 02/25/22 0857  BP: (!) 160/120 (!) 165/115  Pulse: 91   Temp: 97.8 F (36.6 C)   TempSrc: Temporal   Weight: 201 lb (91.2 kg)   Height: _0  (1.803 m)    Body mass index is 28.03 kg/m.   Physical Exam  Constitutional:      Appearance: Normal appearance. He is not ill-appearing.  HENT:     Head: Normocephalic.     Mouth/Throat:     Mouth: Mucous membranes are moist.     Pharynx: Oropharynx is clear.  Eyes:     General: No scleral icterus. Pulmonary:     Effort: Pulmonary effort is normal.     Breath sounds: Normal breath sounds.  Abdominal:     General: Abdomen is flat.     Palpations: Abdomen is soft.  Musculoskeletal:        General: Normal range of motion.     Cervical back: Normal range of motion.  Skin:    Coloration: Skin is not jaundiced or pale.  Neurological:     Mental Status: He is alert and oriented to person, place, and time.  Psychiatric:         Mood and Affect: Mood normal.        Judgment: Judgment normal.      Lab Results Lab Results  Component Value Date   WBC 9.8 05/05/2021   HGB 14.8 05/05/2021   HCT 44.5 05/05/2021   MCV 82.6 05/05/2021   PLT 253 05/05/2021    Lab Results  Component Value Date   CREATININE 1.09 05/05/2021   BUN 13 05/05/2021   NA 135 05/05/2021   K 4.3 05/05/2021   CL 98 05/05/2021   CO2 27 05/05/2021    Lab Results  Component Value Date   ALT 14 05/05/2021   AST 23 05/05/2021   ALKPHOS 55 09/06/2014   BILITOT 0.7 05/05/2021    Lab Results  Component Value Date   CHOL 177 05/05/2021   HDL 73 05/05/2021   LDLCALC 85 05/05/2021   TRIG 93 05/05/2021   CHOLHDL 2.4 05/05/2021   HIV 1 RNA Quant  Date Value  12/25/2021 Not Detected Copies/mL  08/22/2021 Not Detected Copies/mL  06/24/2021 NOT DETECTED copies/mL   CD4 T Cell Abs (/uL)  Date Value  10/31/2020 1,306  02/15/2020 1,150  09/20/2019 1,608     Assessment & Plan:   Problem List Items Addressed This Visit       High   Human immunodeficiency virus (HIV) disease ([place]) - Primary (Chronic)    Well controlled on q45mcabenuva (TD: 27th)  VL undetectable last time. Will defer labs today and plan to check CMP, CBC, RPR, VL, CD4 at next OV with pharmacy team. Vaccines up to date.   Will need to discuss adding pitavastatin for him at next OV with me - trying to prioritize BP optimization first before adding too many things for him.   Next OV with pharmacy is on Feb 21. I will plan to see him back in April for that visit to follow up on HTN and anxiety.         Unprioritized   Anxiety state    Some improvement on Buspar PRN 5 mg tabs, will increase to 10 mg tabs. Stress reduction tactics also discussed with him today to help. Getting this under better control is likely to help his BP as well.       Relevant Medications   busPIRone (BUSPAR) 10 MG tablet   Essential hypertension    BP Readings from Last 3  Encounters:  02/25/22 (!) 165/115  10/21/21 (!) 159/93  09/02/21 (!) 158/90  Uncontrolled and he felt no benefit from headaches on the amlodipine.  Will try combination pill with valsartan 160 mg  + amlodipine 170m  today. He would like to stay away from diuretic as he already has urinary symptoms that bother him.  Goal BP < 140/90. He does not smoke (very rarely vapes) and only social alcohol use.  Sleep and stress measures discussed.   Will see how his blood pressure looks at his next OV in 36m- can increase the valsartan component a step up if remains uncontrolled.       Relevant Medications   amLODipine-valsartan (EXFORGE) 10-160 MG tablet   Other Visit Diagnoses     Human [place] virus (HIV) disease ([place]       Relevant Medications   cabotegravir & rilpivirine ER (CABENUVA) 600 & 900 MG/3ML injection 1 kit (Completed)   Anxiety       Relevant Medications   busPIRone (BUSPAR) 10 MG tablet   Secondary hypertension       Relevant Medications   amLODipine-valsartan (EXFORGE) 10-160 MG tablet      SJanene Madeira MSN, NP-C [facility]for Infectious Disease [email or web address] Pager: [phone]: [address] [phone]   02/25/22  10:35 AM

## 2022-02-25 NOTE — Assessment & Plan Note (Signed)
Some improvement on Buspar PRN 5 mg tabs, will increase to 10 mg tabs. Stress reduction tactics also discussed with him today to help. Getting this under better control is likely to help his BP as well.

## 2022-02-25 NOTE — Assessment & Plan Note (Signed)
Well controlled on q21m cabenuva (TD: 27th)  VL undetectable last time. Will defer labs today and plan to check CMP, CBC, RPR, VL, CD4 at next OV with pharmacy team. Vaccines up to date.   Will need to discuss adding pitavastatin for him at next OV with me - trying to prioritize BP optimization first before adding too many things for him.   Next OV with pharmacy is on Feb 21. I will plan to see him back in April for that visit to follow up on HTN and anxiety.

## 2022-02-25 NOTE — Patient Instructions (Addendum)
Nice to see you today!  Will increase your buspar to 10 mg tablets    Blood pressure goal is less than 140/90. Today yours was 160/112.    Increase water - try to get your liquids in by 6 pm to see if this helps decrease the nocturnal bathroom trips.  Sleep - would try to get at least 7 hours of sleep. Stress - this is a big one for your blood pressure. Finding ways to decrease (medication, breathing techniques, stretching, music, drawing, journaling).    04/22/2022 is your next appointment with Cassie for cabenuva. I will plan to see you back in April for your dose then so we can re-look at your blood pressure  [place] Medicaid Expansion Flyer      How To Apply for Medicaid?

## 2022-02-25 NOTE — Assessment & Plan Note (Signed)
BP Readings from Last 3 Encounters:  02/25/22 (!) 165/115  10/21/21 (!) 159/93  09/02/21 (!) 158/90   Uncontrolled and he felt no benefit from headaches on the amlodipine.  Will try combination pill with valsartan 160 mg  + amlodipine 10mg  today. He would like to stay away from diuretic as he already has urinary symptoms that bother him.  Goal BP < 140/90. He does not smoke (very rarely vapes) and only social alcohol use.  Sleep and stress measures discussed.   Will see how his blood pressure looks at his next OV in 21m - can increase the valsartan component a step up if remains uncontrolled.

## 2022-04-15 ENCOUNTER — Telehealth: Payer: Self-pay

## 2022-04-15 NOTE — Telephone Encounter (Signed)
[facility] Patient Advocate Encounter  Patient's medications have been couriered to [facility] from [place]: [phone], and will be administered on  04/22/2022.

## 2022-04-22 ENCOUNTER — Ambulatory Visit (INDEPENDENT_AMBULATORY_CARE_PROVIDER_SITE_OTHER): Payer: Self-pay | Admitting: Pharmacist

## 2022-04-22 ENCOUNTER — Other Ambulatory Visit: Payer: Self-pay

## 2022-04-22 DIAGNOSIS — B20 Human immunodeficiency virus [HIV] disease: Secondary | ICD-10-CM

## 2022-04-22 MED ORDER — CABOTEGRAVIR & RILPIVIRINE ER 600 & 900 MG/3ML IM SUER
1.0000 | Freq: Once | INTRAMUSCULAR | Status: AC
  Administered 2022-04-22: 1 via INTRAMUSCULAR

## 2022-04-22 NOTE — Progress Notes (Signed)
HPI: Clayton Erickson is a 42 y.o. male who presents to the [facility] clinic for [place] administration.  Patient Active Problem List   Diagnosis Date Noted   Irritability 10/21/2021   Bilateral hand pain 05/05/2021   Knee pain, bilateral 02/12/2019   Low vitamin D level 12/15/2017   Essential hypertension 08/10/2008   Anxiety state 11/18/2007   History of syphilis 10/28/2007   Human immunodeficiency virus (HIV) disease ([place]) 03/10/2006    Patient's Medications  New Prescriptions   No medications on file  Previous Medications   AMLODIPINE-VALSARTAN (EXFORGE) 10-160 MG TABLET    Take 1 tablet by mouth daily.   BUSPIRONE (BUSPAR) 10 MG TABLET    Take 1 tablet (10 mg total) by mouth 3 (three) times daily as needed.   CABOTEGRAVIR & RILPIVIRINE ER (CABENUVA) 600 & 900 MG/3ML INJECTION    Inject 1 kit into the muscle every 8 (eight) weeks.   CHLORHEXIDINE (PERIDEX) 0.12 % SOLUTION    Use as directed 15 mLs in the mouth or throat 2 (two) times daily.   GABAPENTIN (NEURONTIN) 100 MG CAPSULE    Take 1 capsule (100 mg total) by mouth 3 (three) times daily.  Modified Medications   No medications on file  Discontinued Medications   No medications on file    Allergies: No Known Allergies  Past Medical History: Past Medical History:  Diagnosis Date   Boils    Decreased thyroid stimulating hormone (TSH) level 01/2019   HIV infection ([place])    Hypertension    Low vitamin D level    Vitamin D deficiency 01/2019    Social History: Social History   Socioeconomic History   Marital status: Single    Spouse name: Not on file   Number of children: Not on file   Years of education: Not on file   Highest education level: Not on file  Occupational History   Not on file  Tobacco Use   Smoking status: Some Days    Types: Cigarettes   Smokeless tobacco: Never  Vaping Use   Vaping Use: Not on file  Substance and Sexual Activity   Alcohol use: Yes    Comment: occ   Drug use:  Yes    Frequency: 30.0 times per week    Types: Marijuana   Sexual activity: Yes    Partners: Male    Comment: declined condoms  Other Topics Concern   Not on file  Social History Narrative   Not on file   Social Determinants of Health   Financial Resource Strain: Not on file  Food Insecurity: Not on file  Transportation Needs: Not on file  Physical Activity: Not on file  Stress: Not on file  Social Connections: Not on file    Labs: Lab Results  Component Value Date   HIV1RNAQUANT Not Detected 12/25/2021   HIV1RNAQUANT Not Detected 08/22/2021   HIV1RNAQUANT NOT DETECTED 06/24/2021   CD4TABS 1,306 10/31/2020   CD4TABS 1,150 02/15/2020   CD4TABS 1,608 09/20/2019    RPR and STI Lab Results  Component Value Date   LABRPR NON-REACTIVE 10/31/2020   LABRPR NON-REACTIVE 02/15/2020   LABRPR NON-REACTIVE 07/07/2018   LABRPR NON-REACTIVE 01/25/2018   LABRPR NON-REACTIVE 11/18/2017    STI Results GC CT  02/15/2020 10:53 AM Negative  Negative   07/07/2018 12:00 AM Negative  Negative   01/25/2018 12:00 AM Negative  Negative   12/13/2017 12:00 AM Negative    Negative  Negative    Negative  09/06/2014 12:00 AM Negative  Negative   05/16/2014 12:00 AM NG: Negative  CT: Negative     Hepatitis B Lab Results  Component Value Date   HEPBSAB REACTIVE (A) 06/24/2021   HEPBSAG NON-REACTIVE 06/24/2021   Hepatitis C No results found for: "HEPCAB", "HCVRNAPCRQN" Hepatitis A Lab Results  Component Value Date   HAV REACTIVE (A) 06/24/2021   Lipids: Lab Results  Component Value Date   CHOL 177 05/05/2021   TRIG 93 05/05/2021   HDL 73 05/05/2021   CHOLHDL 2.4 05/05/2021   VLDL 13 09/06/2014   LDLCALC 85 05/05/2021    TARGET DATE: 27th  Assessment: Clayton Erickson presents today for maintenance Cabenuva injections. Past injections were tolerated well without issues. He reports no new partners since last visit and politely declines STI testing. His last HIV RNA was undetectable  in October, 2023 and His last CD4 was 1,095 in March 2023. Will collect HIV RNA and CD4 today to ensure HIV remains controlled.   At his last visit with Colletta Maryland he reported anxiety and his blood pressure was elevated. She prescribed Exforge for blood pressure and increased his dose of Buspar for anxiety. Today he reports taking his blood pressure medications regularly and has been feeling much less anxious. Will schedule follow up with Colletta Maryland to evaluate progression of chronic diseases.   He is eligible for mpox, HPV and meningococcal vaccines today. He said he will consider getting these with Colletta Maryland at his next appointment.  Administered cabotegravir 698m/3mL in left upper outer quadrant of the gluteal muscle. Administered rilpivirine 900 mg/369min the right upper outer quadrant of the gluteal muscle. No issues with injections. He will follow up in 2 months for next set of injections.  Plan: - Collect HIV RNA and CD4 - Cabenuva injections administered - Appointment scheduled on 06/19/22 with StColletta Marylandor next CaPeorianjection and anxiety/depression f/u, then with Cassie on 08/20/22 for CaCharles- Call with any issues or questions  Clayton DubinPharmD PGY1 Pharmacy Resident 04/22/2022 4:25 PM

## 2022-04-23 LAB — T-HELPER CELL (CD4) - (RCID CLINIC ONLY)
CD4 % Helper T Cell: 42 % (ref 33–65)
CD4 T Cell Abs: 1112 /uL (ref 400–1790)

## 2022-04-25 LAB — HIV-1 RNA QUANT-NO REFLEX-BLD
HIV 1 RNA Quant: NOT DETECTED Copies/mL
HIV-1 RNA Quant, Log: NOT DETECTED Log cps/mL

## 2022-06-19 ENCOUNTER — Other Ambulatory Visit: Payer: Self-pay | Admitting: Infectious Diseases

## 2022-06-19 ENCOUNTER — Other Ambulatory Visit: Payer: Self-pay | Admitting: Pharmacist

## 2022-06-19 DIAGNOSIS — B20 Human immunodeficiency virus [HIV] disease: Secondary | ICD-10-CM

## 2022-06-19 MED ORDER — CABOTEGRAVIR & RILPIVIRINE ER 600 & 900 MG/3ML IM SUER
1.0000 | INTRAMUSCULAR | 5 refills | Status: DC
Start: 2022-06-19 — End: 2022-12-14

## 2022-06-23 ENCOUNTER — Telehealth: Payer: Self-pay

## 2022-06-23 NOTE — Telephone Encounter (Signed)
[facility] Patient Advocate Encounter  Patient's medication Renaldo Harrison) have been couriered to [facility] from [employer] [facility] and will be administered on the patient next office visit on 06/29/22.  Clearance Coots , CPhT Specialty Pharmacy Patient [facility] Phone: [phone] Fax:  [phone]

## 2022-06-29 ENCOUNTER — Ambulatory Visit: Payer: Self-pay | Admitting: Infectious Diseases

## 2022-06-30 ENCOUNTER — Ambulatory Visit (INDEPENDENT_AMBULATORY_CARE_PROVIDER_SITE_OTHER): Payer: Self-pay | Admitting: Infectious Diseases

## 2022-06-30 ENCOUNTER — Other Ambulatory Visit: Payer: Self-pay

## 2022-06-30 ENCOUNTER — Encounter: Payer: Self-pay | Admitting: Infectious Diseases

## 2022-06-30 VITALS — BP 155/96 | HR 91 | Temp 98.6°F | Ht 71.0 in | Wt 200.0 lb

## 2022-06-30 DIAGNOSIS — Z113 Encounter for screening for infections with a predominantly sexual mode of transmission: Secondary | ICD-10-CM

## 2022-06-30 DIAGNOSIS — I10 Essential (primary) hypertension: Secondary | ICD-10-CM

## 2022-06-30 DIAGNOSIS — B20 Human immunodeficiency virus [HIV] disease: Secondary | ICD-10-CM

## 2022-06-30 DIAGNOSIS — F411 Generalized anxiety disorder: Secondary | ICD-10-CM

## 2022-06-30 MED ORDER — CABOTEGRAVIR & RILPIVIRINE ER 600 & 900 MG/3ML IM SUER
1.0000 | Freq: Once | INTRAMUSCULAR | Status: AC
  Administered 2022-06-30: 1 via INTRAMUSCULAR

## 2022-06-30 NOTE — Progress Notes (Signed)
Name: Clayton Erickson  DOB: 1980-07-01 MRN: [id number] PCP: Shayne Alken, MD     Brief Narrative:  Clayton Erickson is a 42 y.o. man with HIV infection that was originally diagnosed in 2002. His CD4 nadir is "above 200". History of OIs: none.  HIV Risk: MSM.  Previously in care at [facility], [place] [phone]  Previous Regimens: Complera 2016 >> suppressed  Odefsey 2018 (poor adherence) Biktarvy 11-2018  Cabenuva 04-2021  Genotypes: 10-2017 >> no RT, PI or INSTI resistance   Subjective:   Chief Complaint  Patient presents with   Follow-up      HPI:  Clayton Erickson is here for follow up. LOV with me in December 2023 and last Cabenuva injection with our pharmacy team 04/22/22.  This continues to go well.   Anxiety has been improved on increased buspar dose. He only takes this sparingly. Feels like stress is better. Sleep is OK. Likes to prepare food and eat out with his SO. Would like to change up diet a little to lean out. He admittingly is not very good about taking his blood pressure medication. Has significant pill fatigue in general, even when it comes to taking anything OTC.  Has a family member that has been on HD and several that are on HTN meds.  He does not use tobacco products, non-nicotine vape rarely. Social ETOH use.    Review of Systems  Constitutional:  Negative for chills, fever, malaise/fatigue and weight loss.  HENT:  Negative for sore throat.        No dental problems  Respiratory:  Negative for cough and sputum production.   Cardiovascular:  Negative for chest pain and leg swelling.  Gastrointestinal:  Negative for abdominal pain, diarrhea and vomiting.  Genitourinary:  Negative for dysuria and flank pain.  Musculoskeletal:  Negative for joint pain, myalgias and neck pain.  Skin:  Negative for rash.  Neurological:  Negative for dizziness, tingling and headaches.  Psychiatric/Behavioral:  Negative for depression and  substance abuse. The patient is not nervous/anxious and does not have insomnia.     Past Medical History:  Diagnosis Date   Boils    Decreased thyroid stimulating hormone (TSH) level 01/2019   HIV infection (HCC)    Hypertension    Low vitamin D level    Vitamin D deficiency 01/2019    Outpatient Medications Prior to Visit  Medication Sig Dispense Refill   amLODipine-valsartan (EXFORGE) 10-160 MG tablet Take 1 tablet by mouth daily. 30 tablet 1   cabotegravir & rilpivirine ER (CABENUVA) 600 & 900 MG/3ML injection Inject 1 kit into the muscle every 8 (eight) weeks. 6 mL 5   busPIRone (BUSPAR) 10 MG tablet Take 1 tablet (10 mg total) by mouth 3 (three) times daily as needed. (Patient not taking: Reported on 06/30/2022) 90 tablet 2   chlorhexidine (PERIDEX) 0.12 % solution Use as directed 15 mLs in the mouth or throat 2 (two) times daily. (Patient not taking: Reported on 06/30/2022) 473 mL 0   gabapentin (NEURONTIN) 100 MG capsule Take 1 capsule (100 mg total) by mouth 3 (three) times daily. (Patient not taking: Reported on 06/30/2022) 90 capsule 2   No facility-administered medications prior to visit.    No Known Allergies   Social History   Substance and Sexual Activity  Sexual Activity Yes   Partners: Male   Comment: declined condoms    Objective:   Vitals:   06/30/22 0911  BP: (!) 151/101  Pulse: 91  Temp: 98.6 F (37 C)  TempSrc: Temporal  SpO2: 96%  Weight: 200 lb (90.7 kg)  Height: 5\' 11"  (1.803 m)   Body mass index is 27.89 kg/m.   Physical Exam Constitutional:      Appearance: Normal appearance. He is not ill-appearing.  HENT:     Head: Normocephalic.     Mouth/Throat:     Mouth: Mucous membranes are moist.     Pharynx: Oropharynx is clear.  Eyes:     General: No scleral icterus. Pulmonary:     Effort: Pulmonary effort is normal.     Breath sounds: Normal breath sounds.  Abdominal:     General: Abdomen is flat.     Palpations: Abdomen is soft.   Musculoskeletal:        General: Normal range of motion.     Cervical back: Normal range of motion.  Skin:    Coloration: Skin is not jaundiced or pale.  Neurological:     Mental Status: He is alert and oriented to person, place, and time.  Psychiatric:        Mood and Affect: Mood normal.        Judgment: Judgment normal.      Lab Results Lab Results  Component Value Date   WBC 9.8 05/05/2021   HGB 14.8 05/05/2021   HCT 44.5 05/05/2021   MCV 82.6 05/05/2021   PLT 253 05/05/2021    Lab Results  Component Value Date   CREATININE 1.09 05/05/2021   BUN 13 05/05/2021   NA 135 05/05/2021   K 4.3 05/05/2021   CL 98 05/05/2021   CO2 27 05/05/2021    Lab Results  Component Value Date   ALT 14 05/05/2021   AST 23 05/05/2021   ALKPHOS 55 09/06/2014   BILITOT 0.7 05/05/2021    Lab Results  Component Value Date   CHOL 177 05/05/2021   HDL 73 05/05/2021   LDLCALC 85 05/05/2021   TRIG 93 05/05/2021   CHOLHDL 2.4 05/05/2021   HIV 1 RNA Quant (Copies/mL)  Date Value  04/22/2022 Not Detected  12/25/2021 Not Detected  08/22/2021 Not Detected   CD4 T Cell Abs (/uL)  Date Value  04/22/2022 1,112  10/31/2020 1,306  02/15/2020 1,150     Assessment & Plan:   Problem List Items Addressed This Visit       High   Human immunodeficiency virus (HIV) disease (HCC) - Primary (Chronic)    Very well controlled on Cabenuva injections Q23m for > 1 year now. No concerns with access or adherence to medication. They are tolerating the medication well without side effects. No drug interactions identified. I reviewed labs from February of this year and VL < 20.  ADAP re-enrollment in July. No dental needs today.  No concern over anxious/depressed mood. Going well on higher dose buspar if needed.  Sexual health and family planning discussed - asymptomatic urine cytology collected.  Vaccines updated today - deferred   Next visit please draw CMP, HgbA1C, VL.  He will follow up with  pharmacy team as scheduled on 08/20/2022   No follow-ups on file.        Relevant Orders   HIV 1 RNA quant-no reflex-bld     Unprioritized   Anxiety state    Improved on higher dose buspar prn       Essential hypertension    BP Readings from Last 3 Encounters:  06/30/22 (!) 151/101  02/25/22 (!) 165/115  10/21/21 (!) 159/93  We  spent most of our time talking about untreated HTN long term and damage it can do to kidneys, eyes, blood vessels. We talked about other ways to help with stress reduction, adequate restful sleep 8 hours every night, weight loss (though this alone I don't believe will be enough), avoiding tobacco/alcohol products, decreasing processed foods.   He will work on trying to re-incorporate his current BP medication. Will keep dose the same since he largely has not been taking it.       Relevant Orders   Urinalysis   Hemoglobin A1c   COMPLETE METABOLIC PANEL WITH GFR   Other Visit Diagnoses     Routine screening for STI (sexually transmitted infection)       Relevant Orders   Urine cytology ancillary only     [name], MSN, NP-C [facility]  [email or web address] Pager: [phone] Office: [phone] [facility] Main Line: [phone]    06/30/22  9:39 AM

## 2022-06-30 NOTE — Assessment & Plan Note (Signed)
Very well controlled on Cabenuva injections Q2m for > 1 year now. No concerns with access or adherence to medication. They are tolerating the medication well without side effects. No drug interactions identified. I reviewed labs from February of this year and VL < 20.  ADAP re-enrollment in July. No dental needs today.  No concern over anxious/depressed mood. Going well on higher dose buspar if needed.  Sexual health and family planning discussed - asymptomatic urine cytology collected.  Vaccines updated today - deferred   Next visit please draw CMP, HgbA1C, VL.  He will follow up with pharmacy team as scheduled on 08/20/2022   No follow-ups on file.

## 2022-06-30 NOTE — Assessment & Plan Note (Signed)
BP Readings from Last 3 Encounters:  06/30/22 (!) 151/101  02/25/22 (!) 165/115  10/21/21 (!) 159/93   We spent most of our time talking about untreated HTN long term and damage it can do to kidneys, eyes, blood vessels. We talked about other ways to help with stress reduction, adequate restful sleep 8 hours every night, weight loss (though this alone I don't believe will be enough), avoiding tobacco/alcohol products, decreasing processed foods.   He will work on trying to re-incorporate his current BP medication. Will keep dose the same since he largely has not been taking it.

## 2022-06-30 NOTE — Assessment & Plan Note (Signed)
Improved on higher dose buspar prn

## 2022-06-30 NOTE — Addendum Note (Signed)
Addended by: Linna Hoff D on: 06/30/2022 10:08 AM   Modules accepted: Orders

## 2022-07-01 LAB — URINALYSIS
Bilirubin Urine: NEGATIVE
Glucose, UA: NEGATIVE
Hgb urine dipstick: NEGATIVE
Ketones, ur: NEGATIVE
Leukocytes,Ua: NEGATIVE
Nitrite: NEGATIVE
Specific Gravity, Urine: 1.018 (ref 1.001–1.035)
pH: 6.5 (ref 5.0–8.0)

## 2022-07-01 LAB — URINE CYTOLOGY ANCILLARY ONLY
Chlamydia: NEGATIVE
Comment: NEGATIVE
Comment: NORMAL
Neisseria Gonorrhea: NEGATIVE

## 2022-08-17 ENCOUNTER — Encounter: Payer: Self-pay | Admitting: Pharmacist

## 2022-08-18 ENCOUNTER — Telehealth: Payer: Self-pay

## 2022-08-18 NOTE — Telephone Encounter (Signed)
[facility] Patient Advocate Encounter  Patient's medication (Cabenuva) have been couriered to [facility] from [facility] and will be administered on the patient next office visit on 08/20/22.  Dhrithi Riche , CPhT Specialty Pharmacy Patient Advocate [facility] Phone: [phone] Fax:  [phone]  

## 2022-08-20 ENCOUNTER — Ambulatory Visit (INDEPENDENT_AMBULATORY_CARE_PROVIDER_SITE_OTHER): Payer: Self-pay | Admitting: Pharmacist

## 2022-08-20 ENCOUNTER — Other Ambulatory Visit: Payer: Self-pay

## 2022-08-20 DIAGNOSIS — Z113 Encounter for screening for infections with a predominantly sexual mode of transmission: Secondary | ICD-10-CM

## 2022-08-20 DIAGNOSIS — B20 Human immunodeficiency virus [HIV] disease: Secondary | ICD-10-CM

## 2022-08-20 DIAGNOSIS — Z79899 Other long term (current) drug therapy: Secondary | ICD-10-CM

## 2022-08-20 DIAGNOSIS — I10 Essential (primary) hypertension: Secondary | ICD-10-CM

## 2022-08-20 MED ORDER — AMLODIPINE BESYLATE-VALSARTAN 10-320 MG PO TABS: 1.0000 | ORAL_TABLET | Freq: Every day | ORAL | 3 refills | Status: DC

## 2022-08-20 MED ORDER — CABOTEGRAVIR & RILPIVIRINE ER 600 & 900 MG/3ML IM SUER
1.0000 | Freq: Once | INTRAMUSCULAR | Status: AC
  Administered 2022-08-20: 1 via INTRAMUSCULAR

## 2022-08-20 NOTE — Progress Notes (Signed)
HPI: Clayton Erickson is a 42 y.o. male who presents to the [facility] pharmacy clinic for [place] administration.  Patient Active Problem List   Diagnosis Date Noted   Irritability 10/21/2021   Bilateral hand pain 05/05/2021   Knee pain, bilateral 02/12/2019   Low vitamin D level 12/15/2017   Essential hypertension 08/10/2008   Anxiety state 11/18/2007   History of syphilis 10/28/2007   Human immunodeficiency virus (HIV) disease (HCC) 03/10/2006    Patient's Medications  New Prescriptions   No medications on file  Previous Medications   AMLODIPINE-VALSARTAN (EXFORGE) 10-160 MG TABLET    Take 1 tablet by mouth daily.   BUSPIRONE (BUSPAR) 10 MG TABLET    Take 1 tablet (10 mg total) by mouth 3 (three) times daily as needed.   CABOTEGRAVIR & RILPIVIRINE ER (CABENUVA) 600 & 900 MG/3ML INJECTION    Inject 1 kit into the muscle every 8 (eight) weeks.   CHLORHEXIDINE (PERIDEX) 0.12 % SOLUTION    Use as directed 15 mLs in the mouth or throat 2 (two) times daily.   GABAPENTIN (NEURONTIN) 100 MG CAPSULE    Take 1 capsule (100 mg total) by mouth 3 (three) times daily.  Modified Medications   No medications on file  Discontinued Medications   No medications on file    Allergies: No Known Allergies  Past Medical History: Past Medical History:  Diagnosis Date   Boils    Decreased thyroid stimulating hormone (TSH) level 01/2019   HIV infection (HCC)    Hypertension    Low vitamin D level    Vitamin D deficiency 01/2019    Social History: Social History   Socioeconomic History   Marital status: Single    Spouse name: Not on file   Number of children: Not on file   Years of education: Not on file   Highest education level: Not on file  Occupational History   Not on file  Tobacco Use   Smoking status: Some Days    Types: Cigarettes   Smokeless tobacco: Never   Tobacco comments:    Vapes non-nicotine   Vaping Use   Vaping Use: Not on file  Substance and Sexual Activity    Alcohol use: Yes    Comment: occ   Drug use: Yes    Frequency: 30.0 times per week    Types: Marijuana   Sexual activity: Yes    Partners: Male    Comment: declined condoms  Other Topics Concern   Not on file  Social History Narrative   Not on file   Social Determinants of Health   Financial Resource Strain: Not on file  Food Insecurity: Not on file  Transportation Needs: Not on file  Physical Activity: Not on file  Stress: Not on file  Social Connections: Not on file    Labs: Lab Results  Component Value Date   HIV1RNAQUANT Not Detected 04/22/2022   HIV1RNAQUANT Not Detected 12/25/2021   HIV1RNAQUANT Not Detected 08/22/2021   CD4TABS 1,112 04/22/2022   CD4TABS 1,306 10/31/2020   CD4TABS 1,150 02/15/2020    RPR and STI Lab Results  Component Value Date   LABRPR NON-REACTIVE 10/31/2020   LABRPR NON-REACTIVE 02/15/2020   LABRPR NON-REACTIVE 07/07/2018   LABRPR NON-REACTIVE 01/25/2018   LABRPR NON-REACTIVE 11/18/2017    STI Results GC CT  06/30/2022  9:15 AM Negative  Negative   02/15/2020 10:53 AM Negative  Negative   07/07/2018 12:00 AM Negative  Negative   01/25/2018 12:00 AM Negative  Negative   12/13/2017 12:00 AM Negative    Negative  Negative    Negative   09/06/2014 12:00 AM Negative  Negative   05/16/2014 12:00 AM NG: Negative  CT: Negative     Hepatitis B Lab Results  Component Value Date   HEPBSAB REACTIVE (A) 06/24/2021   HEPBSAG NON-REACTIVE 06/24/2021   Hepatitis C No results found for: "HEPCAB", "HCVRNAPCRQN" Hepatitis A Lab Results  Component Value Date   HAV REACTIVE (A) 06/24/2021   Lipids: Lab Results  Component Value Date   CHOL 177 05/05/2021   TRIG 93 05/05/2021   HDL 73 05/05/2021   CHOLHDL 2.4 05/05/2021   VLDL 13 09/06/2014   LDLCALC 85 05/05/2021    TARGET DATE: The 27th  Assessment: Clayton Erickson presents today for his maintenance Cabenuva injections. Past injections were tolerated well without issues. He states that  the Buspar is working well for his anxiety and that he has started taking his Exforge every day. He states he hasn't missed any doses. First BP checked was elevated at 162/115, HR 105. He was a little anxious and hot from being outside. He also says the [employer] in his car doesn't work. I completed his injections and visit and checked again. It was 148/98, HR 92. Discussed with Judeth Cornfield and will increase his Exforge dose. He agrees to urine screening for STIs today. Last HIV RNA was undetectable in February. Will update today as well as a CMP and hemoglobin A1c.  Administered cabotegravir 600mg /77mL in left upper outer quadrant of the gluteal muscle. Administered rilpivirine 900 mg/75mL in the right upper outer quadrant of the gluteal muscle. No issues with injections. He will follow up in 2 months for next set of injections.  Plan: - Cabenuva injections administered - HIV RNA, CMP, and hgbA1c today - Increase Exforge dose to 10/320 mg - Next injections scheduled for 10/21/22 with Judeth Cornfield and 12/22/22 with me - Call with any issues or questions  Keona Bilyeu L. Tresia Revolorio, PharmD, BCIDP, AAHIVP, CPP Clinical Pharmacist Practitioner Infectious Diseases Clinical Pharmacist [facility]

## 2022-08-22 LAB — HIV-1 RNA QUANT-NO REFLEX-BLD
HIV 1 RNA Quant: NOT DETECTED Copies/mL
HIV-1 RNA Quant, Log: NOT DETECTED Log cps/mL

## 2022-08-22 LAB — COMPREHENSIVE METABOLIC PANEL
AG Ratio: 1.6 (calc) (ref 1.0–2.5)
ALT: 21 U/L (ref 9–46)
AST: 21 U/L (ref 10–40)
Albumin: 4.8 g/dL (ref 3.6–5.1)
Alkaline phosphatase (APISO): 59 U/L (ref 36–130)
BUN: 15 mg/dL (ref 7–25)
CO2: 24 mmol/L (ref 20–32)
Calcium: 9.8 mg/dL (ref 8.6–10.3)
Chloride: 97 mmol/L — ABNORMAL LOW (ref 98–110)
Creat: 1 mg/dL (ref 0.60–1.29)
Globulin: 3 g/dL (calc) (ref 1.9–3.7)
Glucose, Bld: 102 mg/dL — ABNORMAL HIGH (ref 65–99)
Potassium: 3.8 mmol/L (ref 3.5–5.3)
Sodium: 133 mmol/L — ABNORMAL LOW (ref 135–146)
Total Bilirubin: 0.8 mg/dL (ref 0.2–1.2)
Total Protein: 7.8 g/dL (ref 6.1–8.1)

## 2022-08-22 LAB — HEMOGLOBIN A1C
Hgb A1c MFr Bld: 5.8 % of total Hgb — ABNORMAL HIGH (ref <5.7)
Mean Plasma Glucose: 120 mg/dL
eAG (mmol/L): 6.6 mmol/L

## 2022-08-22 LAB — C. TRACHOMATIS/N. GONORRHOEAE RNA
C. trachomatis RNA, TMA: NOT DETECTED
N. gonorrhoeae RNA, TMA: NOT DETECTED

## 2022-10-04 ENCOUNTER — Encounter: Payer: Self-pay | Admitting: Pharmacist

## 2022-10-14 ENCOUNTER — Telehealth: Payer: Self-pay

## 2022-10-14 NOTE — Telephone Encounter (Signed)
[facility] Patient Advocate Encounter  Patient's medication(Cabenuva) have been couriered to [facility] from [employer] and will be administered on the patient next office visit on 10/21/22.  Clearance Coots , CPhT Specialty Pharmacy Patient [facility] Phone: [phone] Fax:  [phone]

## 2022-10-21 ENCOUNTER — Other Ambulatory Visit: Payer: Self-pay

## 2022-10-21 ENCOUNTER — Other Ambulatory Visit (HOSPITAL_COMMUNITY)
Admission: RE | Admit: 2022-10-21 | Discharge: 2022-10-21 | Disposition: A | Discharge status: Home / Self Care | Payer: BC Managed Care – PPO | Source: Ambulatory Visit | Attending: Infectious Diseases | Admitting: Infectious Diseases

## 2022-10-21 ENCOUNTER — Ambulatory Visit: Payer: Medicaid Other | Admitting: Infectious Diseases

## 2022-10-21 ENCOUNTER — Other Ambulatory Visit (HOSPITAL_COMMUNITY)
Admission: RE | Admit: 2022-10-21 | Discharge: 2022-10-21 | Disposition: A | Discharge status: Home / Self Care | Payer: Managed Care, Other (non HMO) | Source: Ambulatory Visit | Attending: Infectious Diseases | Admitting: Infectious Diseases

## 2022-10-21 ENCOUNTER — Encounter: Payer: Self-pay | Admitting: Infectious Diseases

## 2022-10-21 VITALS — BP 165/101 | HR 98 | Temp 98.2°F | Resp 16 | Wt 190.6 lb

## 2022-10-21 DIAGNOSIS — Z113 Encounter for screening for infections with a predominantly sexual mode of transmission: Secondary | ICD-10-CM

## 2022-10-21 DIAGNOSIS — Z1212 Encounter for screening for malignant neoplasm of rectum: Secondary | ICD-10-CM | POA: Insufficient documentation

## 2022-10-21 DIAGNOSIS — B20 Human immunodeficiency virus [HIV] disease: Secondary | ICD-10-CM | POA: Diagnosis not present

## 2022-10-21 DIAGNOSIS — I10 Essential (primary) hypertension: Secondary | ICD-10-CM | POA: Diagnosis not present

## 2022-10-21 DIAGNOSIS — F411 Generalized anxiety disorder: Secondary | ICD-10-CM

## 2022-10-21 MED ORDER — CABOTEGRAVIR & RILPIVIRINE ER 600 & 900 MG/3ML IM SUER
1.0000 | Freq: Once | INTRAMUSCULAR | Status: AC
  Administered 2022-10-21: 1 via INTRAMUSCULAR

## 2022-10-21 NOTE — Assessment & Plan Note (Signed)
Very well controlled on q58m cabenuva injections. No concerns with access or adherence to medication. They are tolerating the medication well without side effects. No drug interactions identified. Pertinent lab tests reviewed - will space out lab interval to q51m. No changes to insurance coverage.  No dental needs today.  No concern over anxious/depressed mood.  Sexual health and family planning discussed - asymptomatic screening discussed. Anal cancer screening collected today.   Vaccines updated today - see health maintenance section.   12/22/2022 with our pharmacy

## 2022-10-21 NOTE — Progress Notes (Unsigned)
Name: Clayton Erickson  DOB: April 26, 1980 MRN: [id number] PCP: Shayne Alken, MD     Brief Narrative:  Clayton Erickson is a 42 y.o. man with HIV infection that was originally diagnosed in 2002. His CD4 nadir is "above 200". History of OIs: none.  HIV Risk: MSM.  Previously in care at [facility], [place] [phone]  Previous Regimens: Complera 2016 >> suppressed  Odefsey 2018 (poor adherence) Biktarvy 11-2018  Cabenuva 04-2021  Genotypes: 10-2017 >> no RT, PI or INSTI resistance   Subjective:   Chief Complaint  Patient presents with   Follow-up    B20       HPI:  Clayton Erickson is here for follow up. LOV with me in December 2023 and last Cabenuva injection with our pharmacy team 04/22/22.  This continues to go well.   Blood pressure pills have been out - has had less headaches.   Wt Readings from Last 3 Encounters:  10/21/22 190 lb 9.6 oz (86.5 kg)  06/30/22 200 lb (90.7 kg)  02/25/22 201 lb (91.2 kg)     BP Readings from Last 3 Encounters:  10/21/22 (!) 165/101  06/30/22 (!) 155/96  02/25/22 (!) 165/115    Review of Systems  Constitutional:  Negative for chills, fever, malaise/fatigue and weight loss.  HENT:  Negative for sore throat.        No dental problems  Respiratory:  Negative for cough and sputum production.   Cardiovascular:  Negative for chest pain and leg swelling.  Gastrointestinal:  Negative for abdominal pain, diarrhea and vomiting.  Genitourinary:  Negative for dysuria and flank pain.  Musculoskeletal:  Negative for joint pain, myalgias and neck pain.  Skin:  Negative for rash.  Neurological:  Negative for dizziness, tingling and headaches.  Psychiatric/Behavioral:  Negative for depression and substance abuse. The patient is not nervous/anxious and does not have insomnia.     Past Medical History:  Diagnosis Date   Boils    Decreased thyroid stimulating hormone (TSH) level 01/2019   HIV infection (HCC)     Hypertension    Low vitamin D level    Vitamin D deficiency 01/2019    Outpatient Medications Prior to Visit  Medication Sig Dispense Refill   amLODipine-valsartan (EXFORGE) 10-320 MG tablet Take 1 tablet by mouth daily. 30 tablet 3   busPIRone (BUSPAR) 10 MG tablet Take 1 tablet (10 mg total) by mouth 3 (three) times daily as needed. 90 tablet 2   cabotegravir & rilpivirine ER (CABENUVA) 600 & 900 MG/3ML injection Inject 1 kit into the muscle every 8 (eight) weeks. 6 mL 5   chlorhexidine (PERIDEX) 0.12 % solution Use as directed 15 mLs in the mouth or throat 2 (two) times daily. (Patient not taking: Reported on 06/30/2022) 473 mL 0   gabapentin (NEURONTIN) 100 MG capsule Take 1 capsule (100 mg total) by mouth 3 (three) times daily. (Patient not taking: Reported on 06/30/2022) 90 capsule 2   No facility-administered medications prior to visit.    No Known Allergies   Social History   Substance and Sexual Activity  Sexual Activity Yes   Partners: Male   Comment: declined condoms    Objective:   Vitals:   10/21/22 1352  BP: (!) 165/101  Pulse: 98  Resp: 16  Temp: 98.2 F (36.8 C)  TempSrc: Temporal  SpO2: 98%  Weight: 190 lb 9.6 oz (86.5 kg)   Body mass index is 26.58 kg/m.   Physical Exam Constitutional:  Appearance: Normal appearance. He is not ill-appearing.  HENT:     Head: Normocephalic.     Mouth/Throat:     Mouth: Mucous membranes are moist.     Pharynx: Oropharynx is clear.  Eyes:     General: No scleral icterus. Pulmonary:     Effort: Pulmonary effort is normal.     Breath sounds: Normal breath sounds.  Abdominal:     General: Abdomen is flat.     Palpations: Abdomen is soft.  Musculoskeletal:        General: Normal range of motion.     Cervical back: Normal range of motion.  Skin:    Coloration: Skin is not jaundiced or pale.  Neurological:     Mental Status: He is alert and oriented to person, place, and time.  Psychiatric:        Mood and  Affect: Mood normal.        Judgment: Judgment normal.      Lab Results Lab Results  Component Value Date   WBC 9.8 05/05/2021   HGB 14.8 05/05/2021   HCT 44.5 05/05/2021   MCV 82.6 05/05/2021   PLT 253 05/05/2021    Lab Results  Component Value Date   CREATININE 1.00 08/20/2022   BUN 15 08/20/2022   NA 133 (L) 08/20/2022   K 3.8 08/20/2022   CL 97 (L) 08/20/2022   CO2 24 08/20/2022    Lab Results  Component Value Date   ALT 21 08/20/2022   AST 21 08/20/2022   ALKPHOS 55 09/06/2014   BILITOT 0.8 08/20/2022    Lab Results  Component Value Date   CHOL 177 05/05/2021   HDL 73 05/05/2021   LDLCALC 85 05/05/2021   TRIG 93 05/05/2021   CHOLHDL 2.4 05/05/2021   HIV 1 RNA Quant (Copies/mL)  Date Value  08/20/2022 Not Detected  04/22/2022 Not Detected  12/25/2021 Not Detected   CD4 T Cell Abs (/uL)  Date Value  04/22/2022 1,112  10/31/2020 1,306  02/15/2020 1,150     Assessment & Plan:   Problem List Items Addressed This Visit   None Visit Diagnoses     Screening for rectal cancer    -  Primary   Relevant Orders   Cytology - PAP   Routine screening for STI (sexually transmitted infection)       Relevant Orders   Urine cytology ancillary only      [name], MSN, NP-C [facility]  [email or web address] Pager: [phone] Office: [phone] [facility] Main Line: [phone]    10/21/22  2:20 PM

## 2022-10-21 NOTE — Patient Instructions (Addendum)
Will see what your next blood pressure looks like when you come back. Keep up with your blood pressure medication so we can make sure it is well treated.   12/22/2022 is your next appointment for treatment.   Will see you back in December appointment to check in on your blood pressure again.   Call Lauren at the [facility] office in our building [phone] so she can help you get what you need.

## 2022-10-22 LAB — URINE CYTOLOGY ANCILLARY ONLY
Chlamydia: NEGATIVE
Comment: NEGATIVE
Comment: NORMAL
Neisseria Gonorrhea: NEGATIVE

## 2022-10-22 NOTE — Assessment & Plan Note (Signed)
Continue current dose of combination amlodipine 10 mg - losartan 320 mg Has been out of his Rx. Will get restarted on them. If still > 140/90 would probably add metoprolol since he wants to stay off diuretic.

## 2022-10-22 NOTE — Assessment & Plan Note (Signed)
Buspar is working well - he takes as needed and not very frequent. OK to refill if requested in the future.

## 2022-11-20 LAB — CYTOLOGY - PAP: Diagnosis: NEGATIVE

## 2022-11-22 ENCOUNTER — Other Ambulatory Visit: Payer: Self-pay | Admitting: Infectious Diseases

## 2022-11-28 ENCOUNTER — Other Ambulatory Visit: Payer: Self-pay | Admitting: Infectious Diseases

## 2022-11-30 NOTE — Telephone Encounter (Signed)
Are you ok with refilling this medication along with his amlodipine? Currently does not have PCP; encouraged to do so.   Valarie Cones, LPN

## 2022-12-08 DIAGNOSIS — I10 Essential (primary) hypertension: Secondary | ICD-10-CM

## 2022-12-08 MED ORDER — AMLODIPINE BESYLATE-VALSARTAN 10-320 MG PO TABS: 1.0000 | ORAL_TABLET | Freq: Every day | ORAL | 2 refills | Status: DC

## 2022-12-11 ENCOUNTER — Other Ambulatory Visit (HOSPITAL_COMMUNITY): Payer: Self-pay

## 2022-12-14 ENCOUNTER — Other Ambulatory Visit: Payer: Self-pay

## 2022-12-14 ENCOUNTER — Other Ambulatory Visit: Payer: Self-pay | Admitting: Pharmacist

## 2022-12-14 ENCOUNTER — Other Ambulatory Visit (HOSPITAL_COMMUNITY): Payer: Self-pay

## 2022-12-14 DIAGNOSIS — B20 Human immunodeficiency virus [HIV] disease: Secondary | ICD-10-CM

## 2022-12-14 MED ORDER — CABOTEGRAVIR & RILPIVIRINE ER 600 & 900 MG/3ML IM SUER
1.0000 | INTRAMUSCULAR | 5 refills | Status: DC
Start: 2022-12-14 — End: 2023-12-15
  Filled 2022-12-14: qty 6, 56d supply, fill #0
  Filled 2023-02-02: qty 6, 30d supply, fill #1
  Filled 2023-03-29: qty 6, 30d supply, fill #2
  Filled 2023-06-09: qty 6, 30d supply, fill #3
  Filled 2023-08-16: qty 6, 30d supply, fill #4
  Filled 2023-10-11: qty 6, 30d supply, fill #5

## 2022-12-14 NOTE — Progress Notes (Signed)
Specialty Pharmacy Initial Fill Coordination Note  [name] Drab is a 41 y.o. male contacted today regarding refills of specialty medication(s) Cabotegravir & Rilpivirine   Patient requested Courier to Provider Office   Delivery date: 12/17/22   Verified address: [facility] [address] [place] [address]   Medication will be filled on 12/16/22.   Patient is aware of 0.00 copayment.

## 2022-12-17 ENCOUNTER — Other Ambulatory Visit (HOSPITAL_COMMUNITY): Payer: Self-pay

## 2022-12-17 ENCOUNTER — Telehealth: Payer: Self-pay

## 2022-12-17 NOTE — Telephone Encounter (Signed)
[facility] Patient Advocate Encounter  Patient's medications (CABENUVA) have been couriered to [facility] from [facility] and will be administered at the patients appointment on 12/22/22.  Kae Heller, CPhT Specialty Pharmacy Patient [facility] Phone: [phone] Fax:  [phone]

## 2022-12-21 NOTE — Progress Notes (Unsigned)
HPI: Clayton Erickson is a 42 y.o. male who presents to the [facility] pharmacy clinic for [place] administration.  Patient Active Problem List   Diagnosis Date Noted   Irritability 10/21/2021   Knee pain, bilateral 02/12/2019   Low vitamin D level 12/15/2017   Essential hypertension 08/10/2008   Anxiety state 11/18/2007   History of syphilis 10/28/2007   Human immunodeficiency virus (HIV) disease (HCC) 03/10/2006    Patient's Medications  New Prescriptions   No medications on file  Previous Medications   AMLODIPINE-VALSARTAN (EXFORGE) 10-320 MG TABLET    Take 1 tablet by mouth daily.   BUSPIRONE (BUSPAR) 10 MG TABLET    Take 1 tablet (10 mg total) by mouth 3 (three) times daily as needed.   CABOTEGRAVIR & RILPIVIRINE ER (CABENUVA) 600 & 900 MG/3ML INJECTION    Inject 1 kit into the muscle every 8 (eight) weeks.   CHLORHEXIDINE (PERIDEX) 0.12 % SOLUTION    Use as directed 15 mLs in the mouth or throat 2 (two) times daily.   GABAPENTIN (NEURONTIN) 100 MG CAPSULE    Take 1 capsule (100 mg total) by mouth 3 (three) times daily.  Modified Medications   No medications on file  Discontinued Medications   No medications on file    Allergies: No Known Allergies  Labs: Lab Results  Component Value Date   HIV1RNAQUANT Not Detected 08/20/2022   HIV1RNAQUANT Not Detected 04/22/2022   HIV1RNAQUANT Not Detected 12/25/2021   CD4TABS 1,112 04/22/2022   CD4TABS 1,306 10/31/2020   CD4TABS 1,150 02/15/2020    RPR and STI Lab Results  Component Value Date   LABRPR NON-REACTIVE 10/31/2020   LABRPR NON-REACTIVE 02/15/2020   LABRPR NON-REACTIVE 07/07/2018   LABRPR NON-REACTIVE 01/25/2018   LABRPR NON-REACTIVE 11/18/2017    STI Results GC CT  10/21/2022  2:07 PM Negative  Negative   06/30/2022  9:15 AM Negative  Negative   02/15/2020 10:53 AM Negative  Negative   07/07/2018 12:00 AM Negative  Negative   01/25/2018 12:00 AM Negative  Negative   12/13/2017 12:00 AM Negative     Negative  Negative    Negative   09/06/2014 12:00 AM Negative  Negative   05/16/2014 12:00 AM NG: Negative  CT: Negative     Hepatitis B Lab Results  Component Value Date   HEPBSAB REACTIVE (A) 06/24/2021   HEPBSAG NON-REACTIVE 06/24/2021   Hepatitis C No results found for: "HEPCAB", "HCVRNAPCRQN" Hepatitis A Lab Results  Component Value Date   HAV REACTIVE (A) 06/24/2021   Lipids: Lab Results  Component Value Date   CHOL 177 05/05/2021   TRIG 93 05/05/2021   HDL 73 05/05/2021   CHOLHDL 2.4 05/05/2021   VLDL 13 09/06/2014   LDLCALC 85 05/05/2021    TARGET DATE: The 27th of the month  Assessment: [name] presents today for their maintenance Cabenuva injections. Past injections were tolerated well without issues. Discussed blood pressure medication adherence, [name] reports that he has been unable to obtain from [facility]. Reviewed their messages to him. It appears that his prescription is ready for pickup. He will attempt to fill today, and start taking as soon as possible. Blood pressure in office today: 160/108. HR 96. He denies symptoms of hypertensive urgency/emergency. Sometimes has intermittent headaches attributable to hypertension. I have discussed with him getting a blood pressure cuff for use at home.   Last GC/chlamydia testing negative on 10/21/22. [name] agrees to urine cytology STI testing today. We will give COVID/Flu shot in office today.  [name] would like to discuss Mpox, HPV, and meningococcal vaccine with Judeth Cornfield before receiving.  Administered cabotegravir 600mg /41mL in left upper outer quadrant of the gluteal muscle. Administered rilpivirine 900 mg/50mL in the right upper outer quadrant of the gluteal muscle. No issues with injections.  Plan: - Cabenuva injections administered - STI testing today with urine cytologies - Immunizations: Covid and influenza vaccine in clinic today - Next injections scheduled for 02/19/23 with Judeth Cornfield, 04/28/23 with Cassie - Call with  any issues or questions  Lora Paula, PharmD PGY-2 Infectious Diseases Pharmacy Resident [facility] for Infectious Disease

## 2022-12-22 ENCOUNTER — Other Ambulatory Visit: Payer: Self-pay

## 2022-12-22 ENCOUNTER — Ambulatory Visit: Payer: Self-pay | Admitting: Pharmacist

## 2022-12-22 ENCOUNTER — Other Ambulatory Visit (HOSPITAL_COMMUNITY)
Admission: RE | Admit: 2022-12-22 | Discharge: 2022-12-22 | Disposition: A | Discharge status: Home / Self Care | Payer: BLUE CROSS/BLUE SHIELD | Source: Ambulatory Visit | Attending: Infectious Diseases | Admitting: Infectious Diseases

## 2022-12-22 DIAGNOSIS — Z23 Encounter for immunization: Secondary | ICD-10-CM | POA: Diagnosis not present

## 2022-12-22 DIAGNOSIS — B20 Human immunodeficiency virus [HIV] disease: Secondary | ICD-10-CM | POA: Diagnosis not present

## 2022-12-23 LAB — URINE CYTOLOGY ANCILLARY ONLY
Chlamydia: NEGATIVE
Comment: NEGATIVE
Comment: NORMAL
Neisseria Gonorrhea: NEGATIVE

## 2022-12-23 MED ORDER — CABOTEGRAVIR & RILPIVIRINE ER 600 & 900 MG/3ML IM SUER
1.0000 | Freq: Once | INTRAMUSCULAR | Status: AC
Start: 2022-12-23 — End: 2022-12-22
  Administered 2022-12-22: 1 via INTRAMUSCULAR

## 2023-01-04 ENCOUNTER — Other Ambulatory Visit: Payer: Self-pay

## 2023-01-07 ENCOUNTER — Other Ambulatory Visit: Payer: Self-pay

## 2023-02-02 ENCOUNTER — Other Ambulatory Visit: Payer: Self-pay

## 2023-02-02 ENCOUNTER — Other Ambulatory Visit (HOSPITAL_COMMUNITY): Payer: Self-pay

## 2023-02-02 NOTE — Progress Notes (Signed)
Specialty Pharmacy Refill Coordination Note  Clayton Erickson is a 42 y.o. male assessed today regarding refills of clinic administered specialty medication(s) Cabotegravir & Rilpivirine   Clinic requested Courier to Provider Office   Delivery date: 02/11/23   Verified address: [address] [place] [address]   Medication will be filled on 02/10/23.

## 2023-02-10 ENCOUNTER — Other Ambulatory Visit: Payer: Self-pay

## 2023-02-11 ENCOUNTER — Other Ambulatory Visit (HOSPITAL_COMMUNITY): Payer: Self-pay

## 2023-02-11 ENCOUNTER — Other Ambulatory Visit: Payer: Self-pay

## 2023-02-12 ENCOUNTER — Other Ambulatory Visit: Payer: Self-pay

## 2023-02-12 ENCOUNTER — Other Ambulatory Visit (HOSPITAL_COMMUNITY): Payer: Self-pay

## 2023-02-12 ENCOUNTER — Telehealth: Payer: Self-pay

## 2023-02-12 NOTE — Telephone Encounter (Signed)
Pharmacy Patient Advocate Encounter- Cabenuva BIV-Pharmacy Benefit:  PA was submitted to [facility] and has been approved through: 02/11/23-02/11/24 Authorization# [id number]  Please send prescription to Specialty Pharmacy: [facility]: [phone]  Estimated Copay is: 0.00

## 2023-02-15 ENCOUNTER — Telehealth: Payer: Self-pay

## 2023-02-15 NOTE — Telephone Encounter (Signed)
[facility] Patient Advocate Encounter  Patient's medications CABENIVA  have been couriered to [facility] from [facility] and will be administered at the patients appointment on 02/19/23.  Kae Heller, CPhT Specialty Pharmacy Patient [facility] for Infectious Disease Phone: [phone] Fax:  [phone]

## 2023-02-19 ENCOUNTER — Other Ambulatory Visit: Payer: Self-pay

## 2023-02-19 ENCOUNTER — Encounter: Payer: Self-pay | Admitting: Infectious Diseases

## 2023-02-19 ENCOUNTER — Ambulatory Visit (INDEPENDENT_AMBULATORY_CARE_PROVIDER_SITE_OTHER): Payer: BC Managed Care – PPO | Admitting: Infectious Diseases

## 2023-02-19 VITALS — BP 162/105 | HR 93 | Temp 98.1°F | Ht 71.0 in | Wt 198.0 lb

## 2023-02-19 DIAGNOSIS — B20 Human immunodeficiency virus [HIV] disease: Secondary | ICD-10-CM

## 2023-02-19 DIAGNOSIS — F419 Anxiety disorder, unspecified: Secondary | ICD-10-CM

## 2023-02-19 DIAGNOSIS — I10 Essential (primary) hypertension: Secondary | ICD-10-CM

## 2023-02-19 DIAGNOSIS — Z113 Encounter for screening for infections with a predominantly sexual mode of transmission: Secondary | ICD-10-CM

## 2023-02-19 MED ORDER — CABOTEGRAVIR & RILPIVIRINE ER 600 & 900 MG/3ML IM SUER
1.0000 | Freq: Once | INTRAMUSCULAR | Status: AC
Start: 2023-02-19 — End: 2023-02-19
  Administered 2023-02-19: 1 via INTRAMUSCULAR

## 2023-02-19 MED ORDER — BUSPIRONE HCL 10 MG PO TABS
10.0000 mg | ORAL_TABLET | Freq: Three times a day (TID) | ORAL | 6 refills | Status: DC | PRN
Start: 2023-02-19 — End: 2023-06-13

## 2023-02-19 NOTE — Progress Notes (Signed)
Name: Clayton Erickson  DOB: November 19, 1980 MRN: [id number] PCP: Shayne Alken, MD     Brief Narrative:  Clayton Erickson is a 42 y.o. man with HIV infection that was originally diagnosed in 2002. His CD4 nadir is "above 200". History of OIs: none.  HIV Risk: MSM.  Previously in care at [facility], [place] [phone]  Previous Regimens: Complera 2016 >> suppressed  Odefsey 2018 (poor adherence) Biktarvy 11-2018  Renaldo Harrison 04-2021  Genotypes: 10-2017 >> no RT, PI or INSTI resistance   Subjective:   Chief Complaint  Patient presents with   Follow-up     Discussed the use of AI scribe software for clinical note transcription with the patient, who gave verbal consent to proceed.  History of Present Illness   Clayton Erickson is here for follow up on hypertension, HIV care and cabenuva injection. He is now employed at [employer]. He reports no new health issues or symptoms since the last visit. He has done well taking blood pressure medication. He denies any associated symptoms such as headaches.  The patient also reports experiencing anxiety, particularly related to starting a new job. He finds Buspar helpful in managing his anxiety and requests a refill of the medication. He has agreed to monitor his blood pressure at home to provide more accurate readings outside of the clinical setting.       BP Readings from Last 3 Encounters:  02/19/23 (!) 162/105  10/21/22 (!) 165/101  06/30/22 (!) 155/96    Review of Systems  Constitutional:  Negative for chills, fever, malaise/fatigue and weight loss.  HENT:  Negative for sore throat.        No dental problems  Respiratory:  Negative for cough and sputum production.   Cardiovascular:  Negative for chest pain and leg swelling.  Gastrointestinal:  Negative for abdominal pain, diarrhea and vomiting.  Genitourinary:  Negative for dysuria and flank pain.  Musculoskeletal:  Negative for joint pain, myalgias and neck  pain.  Skin:  Negative for rash.  Neurological:  Negative for dizziness, tingling and headaches.  Psychiatric/Behavioral:  Negative for depression and substance abuse. The patient is not nervous/anxious and does not have insomnia.     Past Medical History:  Diagnosis Date   Boils    Decreased thyroid stimulating hormone (TSH) level 01/2019   HIV infection (HCC)    Hypertension    Low vitamin D level    Vitamin D deficiency 01/2019    Outpatient Medications Prior to Visit  Medication Sig Dispense Refill   amLODipine-valsartan (EXFORGE) 10-320 MG tablet Take 1 tablet by mouth daily. 30 tablet 2   cabotegravir & rilpivirine ER (CABENUVA) 600 & 900 MG/3ML injection Inject 1 kit into the muscle every 8 (eight) weeks. 6 mL 5   busPIRone (BUSPAR) 10 MG tablet Take 1 tablet (10 mg total) by mouth 3 (three) times daily as needed. (Patient not taking: Reported on 02/19/2023) 90 tablet 2   chlorhexidine (PERIDEX) 0.12 % solution Use as directed 15 mLs in the mouth or throat 2 (two) times daily. (Patient not taking: Reported on 02/19/2023) 473 mL 0   gabapentin (NEURONTIN) 100 MG capsule Take 1 capsule (100 mg total) by mouth 3 (three) times daily. (Patient not taking: Reported on 02/19/2023) 90 capsule 2   No facility-administered medications prior to visit.    No Known Allergies   Social History   Substance and Sexual Activity  Sexual Activity Yes   Partners: Male    Objective:  Vitals:   02/19/23 0845 02/19/23 0913  BP: (!) 156/106 (!) 162/105  Pulse: 93   Temp: 98.1 F (36.7 C)   TempSrc: Temporal   SpO2: 97%   Weight: 198 lb (89.8 kg)   Height: 5\' 11"  (1.803 m)    Body mass index is 27.62 kg/m.   Physical Exam Constitutional:      Appearance: Normal appearance. He is not ill-appearing.  HENT:     Head: Normocephalic.     Mouth/Throat:     Mouth: Mucous membranes are moist.     Pharynx: Oropharynx is clear.  Eyes:     General: No scleral icterus. Pulmonary:      Effort: Pulmonary effort is normal.     Breath sounds: Normal breath sounds.  Abdominal:     General: Abdomen is flat.     Palpations: Abdomen is soft.  Musculoskeletal:        General: Normal range of motion.     Cervical back: Normal range of motion.  Skin:    Coloration: Skin is not jaundiced or pale.  Neurological:     Mental Status: He is alert and oriented to person, place, and time.  Psychiatric:        Mood and Affect: Mood normal.        Judgment: Judgment normal.      Lab Results Lab Results  Component Value Date   WBC 9.8 05/05/2021   HGB 14.8 05/05/2021   HCT 44.5 05/05/2021   MCV 82.6 05/05/2021   PLT 253 05/05/2021    Lab Results  Component Value Date   CREATININE 1.00 08/20/2022   BUN 15 08/20/2022   NA 133 (L) 08/20/2022   K 3.8 08/20/2022   CL 97 (L) 08/20/2022   CO2 24 08/20/2022    Lab Results  Component Value Date   ALT 21 08/20/2022   AST 21 08/20/2022   ALKPHOS 55 09/06/2014   BILITOT 0.8 08/20/2022    Lab Results  Component Value Date   CHOL 177 05/05/2021   HDL 73 05/05/2021   LDLCALC 85 05/05/2021   TRIG 93 05/05/2021   CHOLHDL 2.4 05/05/2021   HIV 1 RNA Quant (Copies/mL)  Date Value  08/20/2022 Not Detected  04/22/2022 Not Detected  12/25/2021 Not Detected   CD4 T Cell Abs (/uL)  Date Value  04/22/2022 1,112  10/31/2020 1,306  02/15/2020 1,150     Assessment & Plan:  HIV -  Very well controlled on Q26m Cabenuva . No concerns with access or adherence to medication. They are tolerating the medication well without side effects. No drug interactions identified. Pertinent lab tests ordered today.  No changes to insurance coverage. PA approved for ongoing treatment.  No dental needs today.  No concern over anxious/depressed mood.  Sexual health and family planning discussed - deferred today, routine RPR for monitoring.     Hypertension -  Elevated blood pressure reading in the office (156/106). Patient reports no  headaches. Currently on Exforge max dose.  -Advise patient to purchase a home blood pressure monitor and record readings a few times a week at different times of the day. -If readings consistently above 140/90, consider medication adjustment. -Recheck blood pressure at next visit. -Consider adding atenolol if needed.   Anxiety   Patient reports benefit from Buspar. -Refill Buspar prescription - 10 mg PRN   General Health Maintenance -Order routine blood work. -Continue Cabotegravir injections for HIV prevention. -Follow-up in 6 months or sooner if blood pressure readings  are consistently elevated.        [name], MSN, NP-C [facility] for Infectious Disease [facility]  [email or web address] Pager: [phone] Office: [phone] [facility] Main Line: [phone]    02/19/23  9:18 AM

## 2023-02-19 NOTE — Patient Instructions (Addendum)
It's lovely to see you as always!   Would pick up an automated blood pressure cuff (not the wrist one) and check your blood pressures a few times a week at home.   Blood pressure goal of less than 140/90 at home would be your goal.   Next appointment for cabenuva - 04/28/2023

## 2023-02-22 LAB — HIV-1 RNA QUANT-NO REFLEX-BLD
HIV 1 RNA Quant: NOT DETECTED {copies}/mL
HIV-1 RNA Quant, Log: NOT DETECTED {Log}

## 2023-02-22 LAB — T-HELPER CELLS (CD4) COUNT (NOT AT ARMC)
Absolute CD4: 1186 {cells}/uL (ref 490–1740)
CD4 T Helper %: 41 % (ref 30–61)
Total lymphocyte count: 2868 {cells}/uL (ref 850–3900)

## 2023-02-22 LAB — RPR: RPR Ser Ql: NONREACTIVE

## 2023-03-04 ENCOUNTER — Other Ambulatory Visit (HOSPITAL_COMMUNITY): Payer: Self-pay

## 2023-03-29 ENCOUNTER — Other Ambulatory Visit: Payer: Self-pay

## 2023-03-29 ENCOUNTER — Other Ambulatory Visit (HOSPITAL_COMMUNITY): Payer: Self-pay

## 2023-03-29 NOTE — Progress Notes (Signed)
Specialty Pharmacy Refill Coordination Note  Clayton Erickson is a 43 y.o. male assessed today regarding refills of clinic administered specialty medication(s) Cabotegravir & Rilpivirine [facility])   Clinic requested Courier to Provider Office   Delivery date: 04/20/23   Verified address: [address] [place] [address]   Medication will be filled on 04/19/23.

## 2023-04-15 NOTE — Telephone Encounter (Signed)
This was an old script. Refill sent with new Rx. Nothing to do. thanks

## 2023-04-19 ENCOUNTER — Other Ambulatory Visit: Payer: Self-pay

## 2023-04-19 ENCOUNTER — Ambulatory Visit (INDEPENDENT_AMBULATORY_CARE_PROVIDER_SITE_OTHER): Payer: BLUE CROSS/BLUE SHIELD | Admitting: Internal Medicine

## 2023-04-19 ENCOUNTER — Encounter: Payer: Self-pay | Admitting: Internal Medicine

## 2023-04-19 VITALS — BP 172/125 | HR 102 | Temp 98.3°F | Ht 71.0 in | Wt 210.0 lb

## 2023-04-19 DIAGNOSIS — B20 Human immunodeficiency virus [HIV] disease: Secondary | ICD-10-CM | POA: Diagnosis not present

## 2023-04-19 DIAGNOSIS — F419 Anxiety disorder, unspecified: Secondary | ICD-10-CM | POA: Diagnosis not present

## 2023-04-19 MED ORDER — SERTRALINE HCL 25 MG PO TABS: 25.0000 mg | ORAL_TABLET | Freq: Every day | ORAL | 2 refills | Status: DC

## 2023-04-19 NOTE — Progress Notes (Addendum)
Name: Clayton Erickson  DOB: June 18, 1980 MRN: [id number] PCP: Shayne Alken, MD     Brief Narrative:  Clayton Erickson is a 43 y.o. man with HIV infection that was originally diagnosed in 2002. His CD4 nadir is "above 200". History of OIs: none.  HIV Risk: MSM.  Previously in care at [facility], [place] [phone]  Previous Regimens: Complera 2016 >> suppressed  Odefsey 2018 (poor adherence) Biktarvy 11-2018  Renaldo Harrison 04-2021  Genotypes: 10-2017 >> no RT, PI or INSTI resistance   Subjective:   Chief Complaint  Patient presents with   Follow-up    Anxiety, headaches, frequent urination       History of Present Illness         -patient not due for hiv cabenuva shot  -he saw Judeth Cornfield 01/2023 mainly for anxiety He has been taking buspar His relative just passed away (his uncle -- this is most demanding on his psychological state as he will mainly be responsible for managing funeral). He is also having "mental breakdown" at work and needs excuse letter from his doctor He also has been eating a lot No si/hi Sleeping is very irregular -- not slept for 8 hours yet  Buspar gives him headache so he stopped taking a week ago  He is interested in referral to mental health; he is not yet seeing psychiatry or psychologist/mental health counselor  BP Readings from Last 3 Encounters:  04/19/23 (!) 172/125  02/19/23 (!) 162/105  10/21/22 (!) 165/101    Review of Systems  Constitutional:  Negative for chills, fever, malaise/fatigue and weight loss.  HENT:  Negative for sore throat.        No dental problems  Respiratory:  Negative for cough and sputum production.   Cardiovascular:  Negative for chest pain and leg swelling.  Gastrointestinal:  Negative for abdominal pain, diarrhea and vomiting.  Genitourinary:  Negative for dysuria and flank pain.  Musculoskeletal:  Negative for joint pain, myalgias and neck pain.  Skin:  Negative for  rash.  Neurological:  Negative for dizziness, tingling and headaches.  Psychiatric/Behavioral:  Negative for depression and substance abuse. The patient is not nervous/anxious and does not have insomnia.     Past Medical History:  Diagnosis Date   Boils    Decreased thyroid stimulating hormone (TSH) level 01/2019   HIV infection (HCC)    Hypertension    Low vitamin D level    Vitamin D deficiency 01/2019    Outpatient Medications Prior to Visit  Medication Sig Dispense Refill   amLODipine-valsartan (EXFORGE) 10-320 MG tablet Take 1 tablet by mouth daily. 30 tablet 2   cabotegravir & rilpivirine ER (CABENUVA) 600 & 900 MG/3ML injection Inject 1 kit into the muscle every 8 (eight) weeks. 6 mL 5   busPIRone (BUSPAR) 10 MG tablet Take 1 tablet (10 mg total) by mouth 3 (three) times daily as needed. (Patient not taking: Reported on 04/19/2023) 90 tablet 6   No facility-administered medications prior to visit.    No Known Allergies   Social History   Substance and Sexual Activity  Sexual Activity Yes   Partners: Male    Objective:   Vitals:   04/19/23 0852  BP: (!) 172/125  Pulse: (!) 102  Temp: 98.3 F (36.8 C)  TempSrc: Temporal  SpO2: 97%  Weight: 210 lb (95.3 kg)  Height: 5\' 11"  (1.803 m)   Body mass index is 29.29 kg/m.   Physical Exam Constitutional:  Appearance: Normal appearance. He is not ill-appearing.  HENT:     Head: Normocephalic.     Mouth/Throat:     Mouth: Mucous membranes are moist.     Pharynx: Oropharynx is clear.  Eyes:     General: No scleral icterus. Pulmonary:     Effort: Pulmonary effort is normal.     Breath sounds: Normal breath sounds.  Abdominal:     General: Abdomen is flat.     Palpations: Abdomen is soft.  Musculoskeletal:        General: Normal range of motion.     Cervical back: Normal range of motion.  Skin:    Coloration: Skin is not jaundiced or pale.  Neurological:     Mental Status: He is alert and oriented to  person, place, and time.  Psychiatric:        Mood and Affect: Mood normal.        Judgment: Judgment normal.      Lab Results Lab Results  Component Value Date   WBC 9.8 05/05/2021   HGB 14.8 05/05/2021   HCT 44.5 05/05/2021   MCV 82.6 05/05/2021   PLT 253 05/05/2021    Lab Results  Component Value Date   CREATININE 1.00 08/20/2022   BUN 15 08/20/2022   NA 133 (L) 08/20/2022   K 3.8 08/20/2022   CL 97 (L) 08/20/2022   CO2 24 08/20/2022    Lab Results  Component Value Date   ALT 21 08/20/2022   AST 21 08/20/2022   ALKPHOS 55 09/06/2014   BILITOT 0.8 08/20/2022    Lab Results  Component Value Date   CHOL 177 05/05/2021   HDL 73 05/05/2021   LDLCALC 85 05/05/2021   TRIG 93 05/05/2021   CHOLHDL 2.4 05/05/2021   HIV 1 RNA Quant (Copies/mL)  Date Value  02/19/2023 Not Detected  08/20/2022 Not Detected  04/22/2022 Not Detected   CD4 T Cell Abs (/uL)  Date Value  04/22/2022 1,112  10/31/2020 1,306  02/15/2020 1,150     Assessment & Plan:  HIV -  Very well controlled on Q74m Cabenuva . No concerns with access or adherence to medication. They are tolerating the medication well without side effects. No drug interactions identified. Pertinent lab tests ordered today.  No changes to insurance coverage. PA approved for ongoing treatment.  No dental needs today.  No concern over anxious/depressed mood.  Sexual health and family planning discussed - deferred today, routine RPR for monitoring.   01/2023 cd4 1200 Lab Results  Component Value Date   HIV1RNAQUANT Not Detected 02/19/2023    Continue cabenuva -- no testing needed today for hiv He wants to defer std testing today as well           Anxiety Stopped buspar because he was having headache on it Blood pressure is elevated but not particularly high to cause sx Encourage to check at home  Lots of stressors recently No hi/si No panic attack  Referral to psychiatry and mental health  Start zoloft  -- advise side effects (sexual dysfunction, mania, gi sx); 25 mg for a couple weeks then titrate to 50 mg daily  F/u around 6 weeks with [name]  He wants work excuse letter from Monday 2/10 last week to this Thursday 2/20

## 2023-04-19 NOTE — Patient Instructions (Signed)
Start zoloft 25 mg daily for 2 weeks; if minimal symptoms, then can increase to 50 mg daily   See stephanie dixon in 6 weeks   I have placed referral to psychiatry  Please make appointment at front desk for our psychologist as well

## 2023-04-20 ENCOUNTER — Telehealth: Payer: Self-pay

## 2023-04-20 NOTE — Telephone Encounter (Signed)
[facility] Patient Advocate Encounter  Patient's medications Clayton Erickson have been couriered to [facility] from [facility] and will be administered at the patients appointment on 04/28/23.  Clearance Coots, CPhT Specialty Pharmacy Patient [facility] for Infectious Disease Phone: [phone] Fax:  [phone]

## 2023-04-27 ENCOUNTER — Ambulatory Visit: Payer: BLUE CROSS/BLUE SHIELD | Admitting: Licensed Clinical Social Worker

## 2023-04-28 ENCOUNTER — Other Ambulatory Visit: Payer: Self-pay

## 2023-04-28 ENCOUNTER — Ambulatory Visit (INDEPENDENT_AMBULATORY_CARE_PROVIDER_SITE_OTHER): Payer: Medicaid Other | Admitting: Pharmacist

## 2023-04-28 DIAGNOSIS — B20 Human immunodeficiency virus [HIV] disease: Secondary | ICD-10-CM

## 2023-04-28 DIAGNOSIS — Z23 Encounter for immunization: Secondary | ICD-10-CM

## 2023-04-28 MED ORDER — CABOTEGRAVIR & RILPIVIRINE ER 600 & 900 MG/3ML IM SUER
1.0000 | Freq: Once | INTRAMUSCULAR | Status: AC
  Administered 2023-04-28: 1 via INTRAMUSCULAR

## 2023-04-28 NOTE — Progress Notes (Signed)
 HPI: Clayton Erickson is a 43 y.o. male who presents to the [facility] pharmacy clinic for [place] administration.  Patient Active Problem List   Diagnosis Date Noted   Irritability 10/21/2021   Knee pain, bilateral 02/12/2019   Low vitamin D level 12/15/2017   Essential hypertension 08/10/2008   Anxiety state 11/18/2007   History of syphilis 10/28/2007   Human immunodeficiency virus (HIV) disease (HCC) 03/10/2006    Patient's Medications  New Prescriptions   No medications on file  Previous Medications   AMLODIPINE-VALSARTAN (EXFORGE) 10-320 MG TABLET    Take 1 tablet by mouth daily.   BUSPIRONE (BUSPAR) 10 MG TABLET    Take 1 tablet (10 mg total) by mouth 3 (three) times daily as needed.   CABOTEGRAVIR & RILPIVIRINE ER (CABENUVA) 600 & 900 MG/3ML INJECTION    Inject 1 kit into the muscle every 8 (eight) weeks.   SERTRALINE (ZOLOFT) 25 MG TABLET    Take 1 tablet (25 mg total) by mouth daily.  Modified Medications   No medications on file  Discontinued Medications   No medications on file    Allergies: No Known Allergies  Labs: Lab Results  Component Value Date   HIV1RNAQUANT Not Detected 02/19/2023   HIV1RNAQUANT Not Detected 08/20/2022   HIV1RNAQUANT Not Detected 04/22/2022   CD4TABS 1,112 04/22/2022   CD4TABS 1,306 10/31/2020   CD4TABS 1,150 02/15/2020    RPR and STI Lab Results  Component Value Date   LABRPR NON-REACTIVE 02/19/2023   LABRPR NON-REACTIVE 10/31/2020   LABRPR NON-REACTIVE 02/15/2020   LABRPR NON-REACTIVE 07/07/2018   LABRPR NON-REACTIVE 01/25/2018    STI Results GC CT  12/22/2022  2:21 PM Negative  Negative   10/21/2022  2:07 PM Negative  Negative   06/30/2022  9:15 AM Negative  Negative   02/15/2020 10:53 AM Negative  Negative   07/07/2018 12:00 AM Negative  Negative   01/25/2018 12:00 AM Negative  Negative   12/13/2017 12:00 AM Negative    Negative  Negative    Negative   09/06/2014 12:00 AM Negative  Negative   05/16/2014 12:00 AM NG:  Negative  CT: Negative     Hepatitis B Lab Results  Component Value Date   HEPBSAB REACTIVE (A) 06/24/2021   HEPBSAG NON-REACTIVE 06/24/2021   Hepatitis C No results found for: "HEPCAB", "HCVRNAPCRQN" Hepatitis A Lab Results  Component Value Date   HAV REACTIVE (A) 06/24/2021   Lipids: Lab Results  Component Value Date   CHOL 177 05/05/2021   TRIG 93 05/05/2021   HDL 73 05/05/2021   CHOLHDL 2.4 05/05/2021   VLDL 13 09/06/2014   LDLCALC 85 05/05/2021    TARGET DATE: 27th of the month  Assessment: Clayton Erickson presents today for his maintenance Cabenuva injections. Past injections were tolerated well without issues. Last HIV RNA was not detected on 02/19/23. We will check HIV RNA during his next visit. Overall, the patient is doing okay. He mentions he has been very stressed with new job and recent death in the family. No new partners. He has been with the same person for about 3.5 years now. He declined STI testing today. We discussed eligible vaccines including Menveo and HPV 1/3. He agreed to received both vaccines today. He understands that there will be future doses of these vaccines to complete the series.   Administered cabotegravir 600mg /48mL in left upper outer quadrant of the gluteal muscle. Administered rilpivirine 900 mg/32mL in the right upper outer quadrant of the gluteal muscle. No issues with  injections. He will follow up in 2 months for next set of injections.  Plan: - Cabenuva injections administered - Next injections scheduled for 06/29/23 with Judeth Cornfield - Future injections scheduled for 08/24/2023 with Marchelle Folks - Menveo (1 of 2 doses) administered today (left deltoid), 2nd dose due in April - HPV (1 of 3 doses) administered today (right deltoid), 2nd dose due in April, 3rd dose due in August - Call with any issues or questions  Buddy Duty, PharmD Student [facility] for Infectious Disease

## 2023-05-04 ENCOUNTER — Ambulatory Visit: Payer: BLUE CROSS/BLUE SHIELD | Admitting: Licensed Clinical Social Worker

## 2023-05-22 ENCOUNTER — Other Ambulatory Visit: Payer: Self-pay | Admitting: Infectious Diseases

## 2023-05-22 DIAGNOSIS — I10 Essential (primary) hypertension: Secondary | ICD-10-CM

## 2023-06-01 ENCOUNTER — Ambulatory Visit: Payer: BLUE CROSS/BLUE SHIELD | Admitting: Infectious Diseases

## 2023-06-09 ENCOUNTER — Other Ambulatory Visit (HOSPITAL_COMMUNITY): Payer: Self-pay

## 2023-06-09 ENCOUNTER — Other Ambulatory Visit: Payer: Self-pay

## 2023-06-09 NOTE — Progress Notes (Signed)
 Specialty Pharmacy Refill Coordination Note  Clayton Erickson is a 44 y.o. male assessed today regarding refills of clinic administered specialty medication(s) Cabotegravir & Rilpivirine [facility])   Clinic requested Courier to Provider Office   Delivery date: 06/24/23   Verified address: [address] [place] [address]   Medication will be filled on 06/23/23.

## 2023-06-13 ENCOUNTER — Ambulatory Visit (HOSPITAL_COMMUNITY): Admission: EM | Admit: 2023-06-13 | Discharge: 2023-06-13 | Disposition: A | Discharge status: Home / Self Care

## 2023-06-13 ENCOUNTER — Encounter (HOSPITAL_COMMUNITY): Payer: Self-pay

## 2023-06-13 DIAGNOSIS — B349 Viral infection, unspecified: Secondary | ICD-10-CM | POA: Diagnosis not present

## 2023-06-13 LAB — POC COVID19/FLU A&B COMBO
Covid Antigen, POC: NEGATIVE
Influenza A Antigen, POC: NEGATIVE
Influenza B Antigen, POC: NEGATIVE

## 2023-06-13 MED ORDER — BENZONATATE 200 MG PO CAPS: 200.0000 mg | ORAL_CAPSULE | Freq: Three times a day (TID) | ORAL | 0 refills | Status: DC | PRN

## 2023-06-13 MED ORDER — FEXOFENADINE HCL 60 MG PO TABS: 60.0000 mg | ORAL_TABLET | Freq: Two times a day (BID) | ORAL | 1 refills | Status: DC

## 2023-06-13 MED ORDER — AZELASTINE HCL 0.1 % NA SOLN: 1.0000 | Freq: Two times a day (BID) | NASAL | 1 refills | Status: AC

## 2023-06-13 NOTE — ED Provider Notes (Signed)
 [facility]   Note:  This document was prepared using Dragon voice recognition software and may include unintentional dictation errors.  MRN: [id number] DOB: 1980-04-23  Subjective:   Clayton Erickson is a 43 y.o. male presenting for cough, intermittent chills, pressure in bilateral ears, nasal congestion, and chest congestion with cough since Wednesday.  Patient reports he wakes up several times a night with severe coughing.  Patient reports he has not been taking any over-the-counter medication to treat symptoms.  Patient is concern for viral illness and would like recommendations of what medications he should take to help with symptoms.  No shortness of breath, chest pain, weakness, dizziness  No current facility-administered medications for this encounter.  Current Outpatient Medications:    azelastine (ASTELIN) 0.1 % nasal spray, Place 1 spray into both nostrils 2 (two) times daily. Use in each nostril as directed, Disp: 30 mL, Rfl: 1   benzonatate (TESSALON) 200 MG capsule, Take 1 capsule (200 mg total) by mouth 3 (three) times daily as needed for cough., Disp: 20 capsule, Rfl: 0   fexofenadine (ALLEGRA) 60 MG tablet, Take 1 tablet (60 mg total) by mouth 2 (two) times daily., Disp: 60 tablet, Rfl: 1   amLODipine-valsartan (EXFORGE) 10-320 MG tablet, TAKE 1 TABLET BY MOUTH DAILY, Disp: 30 tablet, Rfl: 2   cabotegravir & rilpivirine ER (CABENUVA) 600 & 900 MG/3ML injection, Inject 1 kit into the muscle every 8 (eight) weeks., Disp: 6 mL, Rfl: 5   sertraline (ZOLOFT) 25 MG tablet, Take 1 tablet (25 mg total) by mouth daily., Disp: 30 tablet, Rfl: 2   No Known Allergies  Past Medical History:  Diagnosis Date   Boils    Decreased thyroid stimulating hormone (TSH) level 01/2019   HIV infection (HCC)    Hypertension    Low vitamin D level    Vitamin D deficiency 01/2019     Past Surgical History:  Procedure Laterality Date   I & D EXTREMITY      Family History   Problem Relation Age of Onset   Hypertension Mother     Social History   Tobacco Use   Smoking status: Former    Types: Cigarettes   Smokeless tobacco: Never   Tobacco comments:    Vapes non-nicotine   Substance Use Topics   Alcohol use: Not Currently    Comment: occ   Drug use: Yes    Frequency: 30.0 times per week    Types: Marijuana    ROS Refer to HPI for ROS details.  Objective:   Vitals: BP (!) 147/97 (BP Location: Right Arm)   Pulse 84   Temp 98 F (36.7 C) (Oral)   Resp 16   SpO2 94%   Physical Exam Vitals and nursing note reviewed.  Constitutional:      General: He is not in acute distress.    Appearance: He is well-developed. He is not ill-appearing or toxic-appearing.  HENT:     Head: Normocephalic.     Right Ear: Tympanic membrane, ear canal and external ear normal. There is no impacted cerumen.     Left Ear: Tympanic membrane, ear canal and external ear normal. There is no impacted cerumen.     Nose: Congestion and rhinorrhea present.     Mouth/Throat:     Mouth: Mucous membranes are moist.     Pharynx: Oropharynx is clear. No oropharyngeal exudate or posterior oropharyngeal erythema.  Eyes:     General:        Right  eye: No discharge.        Left eye: No discharge.     Extraocular Movements: Extraocular movements intact.     Conjunctiva/sclera: Conjunctivae normal.  Cardiovascular:     Rate and Rhythm: Normal rate and regular rhythm.     Heart sounds: No murmur heard. Pulmonary:     Effort: Pulmonary effort is normal. No respiratory distress.     Breath sounds: Normal breath sounds. No stridor. No wheezing, rhonchi or rales.  Skin:    General: Skin is warm and dry.  Neurological:     General: No focal deficit present.     Mental Status: He is alert and oriented to person, place, and time.  Psychiatric:        Mood and Affect: Mood normal.        Behavior: Behavior normal.     Procedures  Results for orders placed or performed  during the hospital encounter of 06/13/23 (from the past 24 hours)  POC Covid19/Flu A&B Antigen     Status: None   Collection Time: 06/13/23 11:09 AM  Result Value Ref Range   Influenza A Antigen, POC Negative Negative   Influenza B Antigen, POC Negative Negative   Covid Antigen, POC Negative Negative    Assessment and Plan :   1. Acute viral syndrome (Primary) - POC Covid19/Flu A&B Antigen completed in UC is negative for COVID and influenza - azelastine (ASTELIN) 0.1 % nasal spray; Place 1 spray into both nostrils 2 (two) times daily. Use in each nostril as directed  Dispense: 30 mL; Refill: 1 - fexofenadine (ALLEGRA) 60 MG tablet; Take 1 tablet (60 mg total) by mouth 2 (two) times daily.  Dispense: 60 tablet; Refill: 1 - benzonatate (TESSALON) 200 MG capsule; Take 1 capsule (200 mg total) by mouth 3 (three) times daily as needed for cough.  Dispense: 20 capsule; Refill: 0 -Continue to monitor symptoms for any change in severity if there is any escalation of current symptoms or development of new symptoms follow-up in ER for further evaluation and management.  Rucker Pridgeon B Marketta Valadez   Shaquil Aldana B, [place] 06/13/23 1115

## 2023-06-13 NOTE — Discharge Instructions (Addendum)
 1. Acute viral syndrome (Primary) - POC Covid19/Flu A&B Antigen completed in UC is negative for COVID and influenza - azelastine (ASTELIN) 0.1 % nasal spray; Place 1 spray into both nostrils 2 (two) times daily. Use in each nostril as directed  Dispense: 30 mL; Refill: 1 - fexofenadine (ALLEGRA) 60 MG tablet; Take 1 tablet (60 mg total) by mouth 2 (two) times daily.  Dispense: 60 tablet; Refill: 1 - benzonatate (TESSALON) 200 MG capsule; Take 1 capsule (200 mg total) by mouth 3 (three) times daily as needed for cough.  Dispense: 20 capsule; Refill: 0 -Continue to monitor symptoms for any change in severity if there is any escalation of current symptoms or development of new symptoms follow-up in ER for further evaluation and management.

## 2023-06-13 NOTE — ED Triage Notes (Signed)
 Patient here today with c/o cough, chills, clogged ears, and nasal congestion, and chest discomfort since Wednesday.

## 2023-06-23 ENCOUNTER — Other Ambulatory Visit: Payer: Self-pay

## 2023-06-24 ENCOUNTER — Telehealth: Payer: Self-pay

## 2023-06-24 NOTE — Telephone Encounter (Signed)
 [facility] Patient Advocate Encounter  Patient's medications CABENUVA  have been couriered to [facility] from [facility] and will be administered at the patients appointment on 06/29/23.  Verline Glow, CPhT Specialty Pharmacy Patient [facility] Phone: [phone] Fax:  [phone]

## 2023-06-29 ENCOUNTER — Ambulatory Visit: Payer: BLUE CROSS/BLUE SHIELD | Admitting: Infectious Diseases

## 2023-06-30 ENCOUNTER — Telehealth: Payer: Self-pay | Admitting: Pharmacist

## 2023-06-30 NOTE — Telephone Encounter (Signed)
 Patient missed Cabenuva  appointment on 4/29. Carolyn Cisco was unable to reach over phone; also sent MyChart message which has not been read. Called patient again today for rescheduling - unable to LVM. Injection window open through this Friday. I am happy to see patient tomorrow or Friday morning in my open slots.  Thanks!  Nicklas Barns, PharmD, CPP, BCIDP, AAHIVP Clinical Pharmacist Practitioner Infectious Diseases Clinical Pharmacist [facility]

## 2023-07-01 NOTE — Telephone Encounter (Signed)
 Same - up to patient now.

## 2023-07-01 NOTE — Telephone Encounter (Signed)
 Attempted third time to reach and states Call can not be completed.

## 2023-07-07 ENCOUNTER — Other Ambulatory Visit: Payer: Self-pay | Admitting: Infectious Diseases

## 2023-07-07 ENCOUNTER — Encounter: Payer: Self-pay | Admitting: Infectious Diseases

## 2023-07-07 ENCOUNTER — Ambulatory Visit (INDEPENDENT_AMBULATORY_CARE_PROVIDER_SITE_OTHER): Admitting: Infectious Diseases

## 2023-07-07 ENCOUNTER — Other Ambulatory Visit: Payer: Self-pay

## 2023-07-07 VITALS — BP 134/92 | HR 103 | Temp 98.6°F | Ht 71.0 in | Wt 209.0 lb

## 2023-07-07 DIAGNOSIS — F411 Generalized anxiety disorder: Secondary | ICD-10-CM

## 2023-07-07 DIAGNOSIS — H00011 Hordeolum externum right upper eyelid: Secondary | ICD-10-CM

## 2023-07-07 DIAGNOSIS — I159 Secondary hypertension, unspecified: Secondary | ICD-10-CM

## 2023-07-07 DIAGNOSIS — B20 Human immunodeficiency virus [HIV] disease: Secondary | ICD-10-CM

## 2023-07-07 DIAGNOSIS — I10 Essential (primary) hypertension: Secondary | ICD-10-CM | POA: Diagnosis not present

## 2023-07-07 DIAGNOSIS — F419 Anxiety disorder, unspecified: Secondary | ICD-10-CM

## 2023-07-07 MED ORDER — CABOTEGRAVIR & RILPIVIRINE ER 600 & 900 MG/3ML IM SUER
1.0000 | Freq: Once | INTRAMUSCULAR | Status: AC
  Administered 2023-07-07: 1 via INTRAMUSCULAR

## 2023-07-07 NOTE — Progress Notes (Unsigned)
 Name: Clayton Erickson  DOB: [birth year] MRN: [id number] PCP: Karron Pagan, MD     Brief Narrative:  Clayton Erickson is a 43 y.o. man with HIV infection that was originally diagnosed in 2002. His CD4 nadir is "above 200". History of OIs: none.  HIV Risk: MSM.  Previously in care at [facility], [place] [phone]  Previous Regimens: Complera 2016 >> suppressed  Odefsey  2018 (poor adherence) Biktarvy  11-2018  Cabenuva  04-2021  Genotypes: 10-2017 >> no RT, PI or INSTI resistance   Subjective:   Chief Complaint  Patient presents with   Follow-up     Discussed the use of AI scribe software for clinical note transcription with the patient, who gave verbal consent to proceed.  History of Present Illness           BP Readings from Last 3 Encounters:  06/13/23 (!) 147/97  04/19/23 (!) 172/125  02/19/23 (!) 162/105    Review of Systems  Constitutional:  Negative for chills, fever, malaise/fatigue and weight loss.  HENT:  Negative for sore throat.        No dental problems  Respiratory:  Negative for cough and sputum production.   Cardiovascular:  Negative for chest pain and leg swelling.  Gastrointestinal:  Negative for abdominal pain, diarrhea and vomiting.  Genitourinary:  Negative for dysuria and flank pain.  Musculoskeletal:  Negative for joint pain, myalgias and neck pain.  Skin:  Negative for rash.  Neurological:  Negative for dizziness, tingling and headaches.  Psychiatric/Behavioral:  Negative for depression and substance abuse. The patient is not nervous/anxious and does not have insomnia.     Past Medical History:  Diagnosis Date   Boils    Decreased thyroid stimulating hormone (TSH) level 01/2019   HIV infection (HCC)    Hypertension    Low vitamin D  level    Vitamin D  deficiency 01/2019    Outpatient Medications Prior to Visit  Medication Sig Dispense Refill   amLODipine -valsartan  (EXFORGE ) 10-320 MG tablet  TAKE 1 TABLET BY MOUTH DAILY 30 tablet 2   azelastine  (ASTELIN ) 0.1 % nasal spray Place 1 spray into both nostrils 2 (two) times daily. Use in each nostril as directed 30 mL 1   benzonatate  (TESSALON ) 200 MG capsule Take 1 capsule (200 mg total) by mouth 3 (three) times daily as needed for cough. 20 capsule 0   cabotegravir  & rilpivirine  ER (CABENUVA ) 600 & 900 MG/3ML injection Inject 1 kit into the muscle every 8 (eight) weeks. 6 mL 5   fexofenadine  (ALLEGRA ) 60 MG tablet Take 1 tablet (60 mg total) by mouth 2 (two) times daily. 60 tablet 1   sertraline  (ZOLOFT ) 25 MG tablet Take 1 tablet (25 mg total) by mouth daily. 30 tablet 2   No facility-administered medications prior to visit.    No Known Allergies   Social History   Substance and Sexual Activity  Sexual Activity Yes   Partners: Male    Objective:   Vitals:   07/07/23 1533  Weight: 209 lb (94.8 kg)  Height: 5\' 11"  (1.803 m)   Body mass index is 29.15 kg/m.   Physical Exam Constitutional:      Appearance: Normal appearance. He is not ill-appearing.  HENT:     Head: Normocephalic.     Mouth/Throat:     Mouth: Mucous membranes are moist.     Pharynx: Oropharynx is clear.  Eyes:     General: No scleral icterus. Pulmonary:     Effort:  Pulmonary effort is normal.     Breath sounds: Normal breath sounds.  Abdominal:     General: Abdomen is flat.     Palpations: Abdomen is soft.  Musculoskeletal:        General: Normal range of motion.     Cervical back: Normal range of motion.  Skin:    Coloration: Skin is not jaundiced or pale.  Neurological:     Mental Status: He is alert and oriented to person, place, and time.  Psychiatric:        Mood and Affect: Mood normal.        Judgment: Judgment normal.      Lab Results Lab Results  Component Value Date   WBC 9.8 05/05/2021   HGB 14.8 05/05/2021   HCT 44.5 05/05/2021   MCV 82.6 05/05/2021   PLT 253 05/05/2021    Lab Results  Component Value Date    CREATININE 1.00 08/20/2022   BUN 15 08/20/2022   NA 133 (L) 08/20/2022   K 3.8 08/20/2022   CL 97 (L) 08/20/2022   CO2 24 08/20/2022    Lab Results  Component Value Date   ALT 21 08/20/2022   AST 21 08/20/2022   ALKPHOS 55 09/06/2014   BILITOT 0.8 08/20/2022    Lab Results  Component Value Date   CHOL 177 05/05/2021   HDL 73 05/05/2021   LDLCALC 85 05/05/2021   TRIG 93 05/05/2021   CHOLHDL 2.4 05/05/2021   HIV 1 RNA Quant (Copies/mL)  Date Value  02/19/2023 Not Detected  08/20/2022 Not Detected  04/22/2022 Not Detected   CD4 T Cell Abs (/uL)  Date Value  04/22/2022 1,112  10/31/2020 1,306  02/15/2020 1,150     Assessment & Plan:  HIV -  Very well controlled on Q59m Cabenuva  . No concerns with access or adherence to medication. They are tolerating the medication well without side effects. No drug interactions identified. Pertinent lab tests ordered today.  No changes to insurance coverage. PA approved for ongoing treatment.  No dental needs today.  No concern over anxious/depressed mood.  Sexual health and family planning discussed - deferred today, routine RPR for monitoring.              Gibson Kurtz, MSN, NP-C [facility]  [email or web address] Pager: [phone] Office: [phone] [facility] Main Line: [phone]    07/07/23  3:35 PM

## 2023-07-07 NOTE — Patient Instructions (Signed)
   olopatadine hydrochloride ophthalmic - this is the generic pataday eye drops - put them in 5-10 min before your contact lenses to help with preventing dryness from allergies. Get the extra strength dose that is one a day   No changes in your medications.   08/24/2023 is your next shot appointment. Will check your viral load at this visit too.

## 2023-07-08 LAB — T-HELPER CELLS (CD4) COUNT (NOT AT ARMC)
CD4 % Helper T Cell: 44 % (ref 33–65)
CD4 T Cell Abs: 1243 /uL (ref 400–1790)

## 2023-07-09 LAB — HIV-1 RNA QUANT-NO REFLEX-BLD
HIV 1 RNA Quant: NOT DETECTED {copies}/mL
HIV-1 RNA Quant, Log: NOT DETECTED {Log_copies}/mL

## 2023-07-16 ENCOUNTER — Ambulatory Visit: Payer: Self-pay | Admitting: Infectious Diseases

## 2023-08-14 ENCOUNTER — Other Ambulatory Visit: Payer: Self-pay | Admitting: Internal Medicine

## 2023-08-16 ENCOUNTER — Other Ambulatory Visit (HOSPITAL_COMMUNITY): Payer: Self-pay

## 2023-08-16 ENCOUNTER — Other Ambulatory Visit: Payer: Self-pay

## 2023-08-16 NOTE — Telephone Encounter (Signed)
 Please advise, Dr. Everlean Hoar note mentioned he had stopped the buspar  so he added sertraline . Most recent note from May indicates he's back on buspar . Thanks!

## 2023-08-16 NOTE — Progress Notes (Signed)
 Specialty Pharmacy Refill Coordination Note  Clayton Erickson is a 43 y.o. male assessed today regarding refills of clinic administered specialty medication(s) Cabotegravir  & Rilpivirine  (CABENUVA )   Clinic requested Courier to Provider Office   Delivery date: 08/19/23   Verified address: [address] [employer] [place] [address]   Medication will be filled on 08/18/23.

## 2023-08-19 ENCOUNTER — Telehealth: Payer: Self-pay

## 2023-08-19 NOTE — Telephone Encounter (Signed)
 [facility] Patient Advocate Encounter  Patient's medications CABENUVA  have been couriered to [facility] from [facility] and will be administered at the patients appointment on 08/24/23.  Verline Glow, CPhT Specialty Pharmacy Patient [facility] Phone: [phone] Fax:  [phone]

## 2023-08-19 NOTE — Progress Notes (Unsigned)
 HPI: Clayton Erickson is a 43 y.o. male who presents to the [facility] pharmacy clinic for Cabenuva  administration.  Patient Active Problem List   Diagnosis Date Noted   Irritability 10/21/2021   Knee pain, bilateral 02/12/2019   Low vitamin D  level 12/15/2017   Essential hypertension 08/10/2008   Anxiety state 11/18/2007   History of syphilis 10/28/2007   Human immunodeficiency virus (HIV) disease (HCC) 03/10/2006    Patient's Medications  New Prescriptions   No medications on file  Previous Medications   AMLODIPINE -VALSARTAN  (EXFORGE ) 10-160 MG TABLET    TAKE 1 TABLET BY MOUTH DAILY   AZELASTINE  (ASTELIN ) 0.1 % NASAL SPRAY    Place 1 spray into both nostrils 2 (two) times daily. Use in each nostril as directed   BENZONATATE  (TESSALON ) 200 MG CAPSULE    Take 1 capsule (200 mg total) by mouth 3 (three) times daily as needed for cough.   BUSPIRONE  (BUSPAR ) 10 MG TABLET    TAKE 1 TABLET(10 MG) BY MOUTH THREE TIMES DAILY AS NEEDED   CABOTEGRAVIR  & RILPIVIRINE  ER (CABENUVA ) 600 & 900 MG/3ML INJECTION    Inject 1 kit into the muscle every 8 (eight) weeks.   FEXOFENADINE  (ALLEGRA ) 60 MG TABLET    Take 1 tablet (60 mg total) by mouth 2 (two) times daily.   SERTRALINE  (ZOLOFT ) 25 MG TABLET    TAKE 1 TABLET(25 MG) BY MOUTH DAILY  Modified Medications   No medications on file  Discontinued Medications   No medications on file    Allergies: No Known Allergies  Past Medical History: Past Medical History:  Diagnosis Date   Boils    Decreased thyroid stimulating hormone (TSH) level 01/2019   HIV infection (HCC)    Hypertension    Low vitamin D  level    Vitamin D  deficiency 01/2019    Social History: Social History   Socioeconomic History   Marital status: Single    Spouse name: Not on file   Number of children: Not on file   Years of education: Not on file   Highest education level: Not on file  Occupational History   Not on file  Tobacco Use   Smoking status: Former    Types:  Cigarettes   Smokeless tobacco: Never   Tobacco comments:    Vapes non-nicotine   Vaping Use   Vaping status: Not on file  Substance and Sexual Activity   Alcohol use: Not Currently    Comment: occ   Drug use: Yes    Frequency: 30.0 times per week    Types: Marijuana   Sexual activity: Yes    Partners: Male  Other Topics Concern   Not on file  Social History Narrative   Not on file   Social Drivers of Health   Financial Resource Strain: Not on file  Food Insecurity: Not on file  Transportation Needs: Not on file  Physical Activity: Not on file  Stress: Not on file  Social Connections: Not on file    Labs: Lab Results  Component Value Date   HIV1RNAQUANT NOT DETECTED 07/07/2023   HIV1RNAQUANT Not Detected 02/19/2023   HIV1RNAQUANT Not Detected 08/20/2022   CD4TABS 1,243 07/07/2023   CD4TABS 1,112 04/22/2022   CD4TABS 1,306 10/31/2020    RPR and STI Lab Results  Component Value Date   LABRPR NON-REACTIVE 02/19/2023   LABRPR NON-REACTIVE 10/31/2020   LABRPR NON-REACTIVE 02/15/2020   LABRPR NON-REACTIVE 07/07/2018   LABRPR NON-REACTIVE 01/25/2018    STI Results GC CT  12/22/2022  2:21 PM Negative  Negative   10/21/2022  2:07 PM Negative  Negative   06/30/2022  9:15 AM Negative  Negative   02/15/2020 10:53 AM Negative  Negative   07/07/2018 12:00 AM Negative  Negative   01/25/2018 12:00 AM Negative  Negative   12/13/2017 12:00 AM Negative    Negative  Negative    Negative   09/06/2014 12:00 AM Negative  Negative   05/16/2014 12:00 AM NG: Negative  CT: Negative     Hepatitis B Lab Results  Component Value Date   HEPBSAB REACTIVE (A) 06/24/2021   HEPBSAG NON-REACTIVE 06/24/2021   Hepatitis C No results found for: HEPCAB, HCVRNAPCRQN Hepatitis A Lab Results  Component Value Date   HAV REACTIVE (A) 06/24/2021   Lipids: Lab Results  Component Value Date   CHOL 177 05/05/2021   TRIG 93 05/05/2021   HDL 73 05/05/2021   CHOLHDL 2.4  05/05/2021   VLDL 13 09/06/2014   LDLCALC 85 05/05/2021    TARGET DATE:  The 27th of the month  Assessment: [name] presents today for their maintenance Cabenuva  injections. Initial/past injections were tolerated well without issues. No problems with systemic effects of injections. Patient did experience ***.   Administered cabotegravir  600mg /72mL in left upper outer quadrant of the gluteal muscle. Administered rilpivirine  900 mg/3mL in the right upper outer quadrant of the gluteal muscle. Monitored patient for 10 minutes after injection. Injections were tolerated well without issue. Patient will follow up in 2 months for next injection. Will defer HIV RNA testing as it was recently asssessed in May. When labs are next assesssed, will need routine CMP, CBC, and lipid screening.   Politely declines STI testing as he remains exclusive with one partner. Due for 2/2 Menveo, 2/3 HPV, and Shingles vaccines; accepts *** today.   Plan: - Cabenuva  injections administered - Next injections scheduled for *** - Call with any issues or questions  Nicklas Barns, PharmD, CPP, BCIDP, AAHIVP Clinical Pharmacist Practitioner Infectious Diseases Clinical Pharmacist [facility]

## 2023-08-24 ENCOUNTER — Ambulatory Visit: Payer: Self-pay | Admitting: Pharmacist

## 2023-08-24 ENCOUNTER — Other Ambulatory Visit: Payer: Self-pay

## 2023-08-24 VITALS — BP 145/101 | Wt 204.2 lb

## 2023-08-24 DIAGNOSIS — B20 Human immunodeficiency virus [HIV] disease: Secondary | ICD-10-CM

## 2023-08-24 DIAGNOSIS — Z23 Encounter for immunization: Secondary | ICD-10-CM | POA: Diagnosis not present

## 2023-08-24 MED ORDER — CABOTEGRAVIR & RILPIVIRINE ER 600 & 900 MG/3ML IM SUER
1.0000 | Freq: Once | INTRAMUSCULAR | Status: AC
  Administered 2023-08-24: 1 via INTRAMUSCULAR

## 2023-09-21 ENCOUNTER — Telehealth: Payer: Self-pay | Admitting: Infectious Diseases

## 2023-09-21 NOTE — Telephone Encounter (Signed)
 error

## 2023-09-23 ENCOUNTER — Ambulatory Visit: Admitting: Internal Medicine

## 2023-10-11 ENCOUNTER — Other Ambulatory Visit (HOSPITAL_COMMUNITY): Payer: Self-pay

## 2023-10-11 ENCOUNTER — Other Ambulatory Visit: Payer: Self-pay

## 2023-10-11 NOTE — Progress Notes (Signed)
 Specialty Pharmacy Refill Coordination Note  Clayton Erickson is a 43 y.o. male assessed today regarding refills of clinic administered specialty medication(s) Cabotegravir  & Rilpivirine  (CABENUVA )   Clinic requested Courier to Provider Office   Delivery date: 10/18/23   Verified address: [address] [place] [address]   Medication will be filled on 10/15/23.

## 2023-10-15 ENCOUNTER — Other Ambulatory Visit: Payer: Self-pay

## 2023-10-18 ENCOUNTER — Telehealth: Payer: Self-pay

## 2023-10-18 NOTE — Telephone Encounter (Signed)
 [facility] Patient Advocate Encounter  Patient's medications Cabenuva  have been couriered to [facility] from [facility] and will be administered at the patients appointment on 10/22/23.  Arland Hutchinson, CPhT Specialty Pharmacy Patient [facility] for Infectious Disease Phone: [phone] Fax:  [phone]

## 2023-10-22 ENCOUNTER — Other Ambulatory Visit: Payer: Self-pay

## 2023-10-22 ENCOUNTER — Encounter: Payer: Self-pay | Admitting: Infectious Diseases

## 2023-10-22 ENCOUNTER — Ambulatory Visit: Admitting: Infectious Diseases

## 2023-10-22 VITALS — BP 129/87 | HR 95 | Temp 98.1°F | Ht 71.0 in | Wt 202.0 lb

## 2023-10-22 DIAGNOSIS — R350 Frequency of micturition: Secondary | ICD-10-CM

## 2023-10-22 DIAGNOSIS — B20 Human immunodeficiency virus [HIV] disease: Secondary | ICD-10-CM

## 2023-10-22 DIAGNOSIS — Z1159 Encounter for screening for other viral diseases: Secondary | ICD-10-CM

## 2023-10-22 DIAGNOSIS — H9203 Otalgia, bilateral: Secondary | ICD-10-CM

## 2023-10-22 DIAGNOSIS — I10 Essential (primary) hypertension: Secondary | ICD-10-CM | POA: Diagnosis not present

## 2023-10-22 MED ORDER — CABOTEGRAVIR & RILPIVIRINE ER 600 & 900 MG/3ML IM SUER
1.0000 | Freq: Once | INTRAMUSCULAR | Status: AC
  Administered 2023-10-22: 1 via INTRAMUSCULAR

## 2023-10-22 NOTE — Progress Notes (Signed)
 Name: Clayton Erickson  DOB: March 18, 1980 MRN: [id number] PCP: Delores Corean Pollen, MD     Brief Narrative:  Clayton Erickson is a 43 y.o. man with HIV infection that was originally diagnosed in 2002. His CD4 nadir is above 200. History of OIs: none.  HIV Risk: MSM.  Previously in care at [facility], [place] [phone]  Previous Regimens: Complera 2016 >> suppressed  Odefsey  2018 (poor adherence) Biktarvy  11-2018  Cabenuva  04-2021  Genotypes: 10-2017 >> no RT, PI or INSTI resistance   Subjective   Subjective:   Chief Complaint  Patient presents with   Follow-up     Discussed the use of AI scribe software for clinical note transcription with the patient, who gave verbal consent to proceed.  History of Present Illness   Clayton Erickson is a 43 year old male who presents with frequent nighttime urination and abdominal bloating.  He experiences frequent nighttime urination, waking up seven to eight times a night with significant urgency, often feeling the need to urinate immediately upon waking or even while dreaming. During the day, he has a sudden and urgent need to urinate, sometimes resulting in incontinence. This issue has been ongoing for a long time, and he notes a family history of similar symptoms, as his mother experienced the same issue.  He reports abdominal bloating, describing his stomach as 'tight' and noting that it has not reduced in size despite efforts to change his diet and increase physical activity. No associated pain, diarrhea, or excessive gas. He has attempted dietary changes, such as reducing sugar, pasta, and alcohol intake, and increasing water consumption.  He mentions a history of earwax buildup, which causes discomfort and blockage in his ears. He has tried over-the-counter earwax removal kits without success and avoids using Q-tips to prevent further compaction.         10/22/2023   10:26 AM 07/07/2023    3:50  PM 04/19/2023    8:54 AM  Depression screen PHQ 2/9  Decreased Interest 0 0 3  Down, Depressed, Hopeless 0 1 3  PHQ - 2 Score 0 1 6  Altered sleeping 3 1 3   Tired, decreased energy 1 1 3   Change in appetite 0 0 3  Feeling bad or failure about yourself  0 0 3  Trouble concentrating 0 0 2  Moving slowly or fidgety/restless 0 0 1  Suicidal thoughts 0 0 0  PHQ-9 Score 4 3 21   Difficult doing work/chores Somewhat difficult -- Extremely dIfficult      10/22/2023   10:27 AM 07/07/2023    3:50 PM  GAD 7 : Generalized Anxiety Score  Nervous, Anxious, on Edge 0 2  Control/stop worrying 1 1  Worry too much - different things 0 1  Trouble relaxing 0 1  Restless 0 0  Easily annoyed or irritable 0 1  Afraid - awful might happen 0 0  Total GAD 7 Score 1 6  Anxiety Difficulty Not difficult at all Somewhat difficult     Review of Systems  All other systems reviewed and are negative.   Past Medical History:  Diagnosis Date   Boils    Decreased thyroid stimulating hormone (TSH) level 01/2019   HIV infection (HCC)    Hypertension    Low vitamin D  level    Vitamin D  deficiency 01/2019    Outpatient Medications Prior to Visit  Medication Sig Dispense Refill   amLODipine -valsartan  (EXFORGE ) 10-160 MG tablet TAKE 1 TABLET BY MOUTH DAILY  30 tablet 11   busPIRone  (BUSPAR ) 10 MG tablet TAKE 1 TABLET(10 MG) BY MOUTH THREE TIMES DAILY AS NEEDED 90 tablet 2   cabotegravir  & rilpivirine  ER (CABENUVA ) 600 & 900 MG/3ML injection Inject 1 kit into the muscle every 8 (eight) weeks. 6 mL 5   fexofenadine  (ALLEGRA ) 60 MG tablet Take 1 tablet (60 mg total) by mouth 2 (two) times daily. 60 tablet 1   sertraline  (ZOLOFT ) 25 MG tablet TAKE 1 TABLET(25 MG) BY MOUTH DAILY 30 tablet 2   azelastine  (ASTELIN ) 0.1 % nasal spray Place 1 spray into both nostrils 2 (two) times daily. Use in each nostril as directed 30 mL 1   No facility-administered medications prior to visit.    No Known Allergies   Social  History   Substance and Sexual Activity  Sexual Activity Yes   Partners: Male      Objective   Objective:   Vitals:   10/22/23 1024  BP: 129/87  Pulse: 95  Temp: 98.1 F (36.7 C)  TempSrc: Temporal  SpO2: 95%  Weight: 202 lb (91.6 kg)  Height: 5' 11 (1.803 m)   Body mass index is 28.17 kg/m.   Physical Exam Constitutional:      Appearance: Normal appearance. He is not ill-appearing.  HENT:     Head: Normocephalic.     Mouth/Throat:     Mouth: Mucous membranes are moist.     Pharynx: Oropharynx is clear.  Eyes:     General: No scleral icterus. Pulmonary:     Effort: Pulmonary effort is normal.     Breath sounds: Normal breath sounds.  Abdominal:     General: Abdomen is flat.     Palpations: Abdomen is soft.  Musculoskeletal:        General: Normal range of motion.     Cervical back: Normal range of motion.  Skin:    Coloration: Skin is not jaundiced or pale.  Neurological:     Mental Status: He is alert and oriented to person, place, and time.  Psychiatric:        Mood and Affect: Mood normal.        Judgment: Judgment normal.     Lab Results Lab Results  Component Value Date   WBC 9.8 05/05/2021   HGB 14.8 05/05/2021   HCT 44.5 05/05/2021   MCV 82.6 05/05/2021   PLT 253 05/05/2021    Lab Results  Component Value Date   CREATININE 0.95 10/22/2023   BUN 10 10/22/2023   NA 132 (L) 10/22/2023   K 4.3 10/22/2023   CL 94 (L) 10/22/2023   CO2 25 10/22/2023    Lab Results  Component Value Date   ALT 21 10/22/2023   AST 21 10/22/2023   ALKPHOS 55 09/06/2014   BILITOT 0.7 10/22/2023    Lab Results  Component Value Date   CHOL 177 05/05/2021   HDL 73 05/05/2021   LDLCALC 85 05/05/2021   TRIG 93 05/05/2021   CHOLHDL 2.4 05/05/2021   HIV 1 RNA Quant  Date Value  10/22/2023 NOT DETECTED copies/mL  07/07/2023 NOT DETECTED copies/mL  02/19/2023 Not Detected Copies/mL   CD4 T Cell Abs (/uL)  Date Value  07/07/2023 1,243  04/22/2022  1,112  10/31/2020 1,306    The 10-year ASCVD risk score (Arnett DK, et al., 2019) is: 5.4%   Values used to calculate the score:     Age: 52 years     Clincally relevant sex: Male  Is Non-Hispanic African American: Yes     Diabetic: No     Tobacco smoker: No     Systolic Blood Pressure: 129 mmHg     Is BP treated: Yes     HDL Cholesterol: 73 mg/dL     Total Cholesterol: 177 mg/dL   On Statin Therapy - yes   Anal Cancer Screen:     Component Value Date/Time   DIAGPAP  10/21/2022 1409    - Negative for intraepithelial lesion or malignancy (NILM)   ADEQPAP Satisfactory for evaluation. 10/21/2022 1409      Assessment & Plan:     HIV Infection -  Well controlled on Cabanuva Q2m.  - continue plan of care  - pertinent labs today then 2x annually    ?Overactive bladder with urinary urgency and nocturia - Chronic urinary urgency and nocturia, with frequent urination at night and urgency during the day, suggestive of overactive bladder rather than prostate issues. Symptoms have been ongoing for a long time, possibly familial as his mother had similar issues. - Perform urinalysis to check for abnormalities. - Order prostate-specific antigen (PSA) level to assess for prostate inflammation. - Refer to urology for further evaluation and management. - Consider trial of antispasmodic medication for overactive bladder if urinalysis and PSA are normal. Discuss potential side effects, including dry mouth and dry eyes.  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 Previous attempts with over-the-counter earwax removal kits have been ineffective. Concerns about using Q-tips and other methods that may worsen the condition. - Refer to ear, nose, and throat (ENT) specialist for evaluation and management of earwax impaction.  Weight Changes - Complaints of abdominal fullness and tightness, likely due to visceral fat accumulation rather than  gastrointestinal issues. No associated pain or gastrointestinal symptoms like diarrhea or constipation. Potential risk for fatty liver disease due to visceral fat. - Advise weight loss through dietary changes and exercise. - Recommend reducing sugar intake and processed foods. - Encourage a balanced diet with more vegetables and less processed carbohydrates. - Suggest regular exercise, including strength training, to reduce visceral fat.  Hypertension - Hypertension is well-managed with medication. No recent headaches since starting treatment, indicating effective blood pressure control. - Continue current hypertension management plan.  Recording duration: 17 minutes       Meds ordered this encounter  Medications   cabotegravir  & rilpivirine  ER (CABENUVA ) 600 & 900 MG/3ML injection 1 kit   Orders Placed This Encounter  Procedures   Urinalysis, Routine w reflex microscopic   PSA   Measles/Mumps/Rubella Immunity   HIV 1 RNA quant-no reflex-bld   COMPLETE METABOLIC PANEL WITHOUT GFR   T-helper cells (CD4) count (not at [facility])   Ambulatory referral to ENT    Referral Priority:   Routine    Referral Type:   Consultation    Referral Reason:   Specialty Services Required    Requested Specialty:   Otolaryngology    Number of Visits Requested:   1   Ambulatory referral to Urology    Referral Priority:   Routine    Referral Type:   Consultation    Referral Reason:   Specialty Services Required    Referred to Provider:   Elisabeth Valli BIRCH, MD    Requested Specialty:   Urology    Number of Visits Requested:   1   12/24/2023 next injection appt TD 27 Even       Corean Fireman, MSN, NP-C [facility] for  Infectious Disease [facility]  [email or web address] Pager: [phone] Office: [phone] [facility] Main Line: [phone]    11/08/23  3:48 PM

## 2023-10-22 NOTE — Patient Instructions (Addendum)
 Next Injection Appt: 12/24/2023   Will check your prostate with blood work and urine today -

## 2023-10-23 LAB — URINALYSIS, ROUTINE W REFLEX MICROSCOPIC
Bilirubin Urine: NEGATIVE
Glucose, UA: NEGATIVE
Hgb urine dipstick: NEGATIVE
Ketones, ur: NEGATIVE
Leukocytes,Ua: NEGATIVE
Nitrite: NEGATIVE
Protein, ur: NEGATIVE
Specific Gravity, Urine: 1.007 (ref 1.001–1.035)
pH: 7 (ref 5.0–8.0)

## 2023-10-25 ENCOUNTER — Encounter (INDEPENDENT_AMBULATORY_CARE_PROVIDER_SITE_OTHER): Payer: Self-pay

## 2023-10-26 LAB — COMPLETE METABOLIC PANEL WITHOUT GFR
AG Ratio: 1.6 (calc) (ref 1.0–2.5)
ALT: 21 U/L (ref 9–46)
AST: 21 U/L (ref 10–40)
Albumin: 5.1 g/dL (ref 3.6–5.1)
Alkaline phosphatase (APISO): 65 U/L (ref 36–130)
BUN: 10 mg/dL (ref 7–25)
CO2: 25 mmol/L (ref 20–32)
Calcium: 10.2 mg/dL (ref 8.6–10.3)
Chloride: 94 mmol/L — ABNORMAL LOW (ref 98–110)
Creat: 0.95 mg/dL (ref 0.60–1.29)
Globulin: 3.2 g/dL (ref 1.9–3.7)
Glucose, Bld: 99 mg/dL (ref 65–99)
Potassium: 4.3 mmol/L (ref 3.5–5.3)
Sodium: 132 mmol/L — ABNORMAL LOW (ref 135–146)
Total Bilirubin: 0.7 mg/dL (ref 0.2–1.2)
Total Protein: 8.3 g/dL — ABNORMAL HIGH (ref 6.1–8.1)

## 2023-10-26 LAB — MEASLES/MUMPS/RUBELLA IMMUNITY
Mumps IgG: 87.6 [AU]/ml
Rubella: 2.41 {index}
Rubeola IgG: 199 [AU]/ml

## 2023-10-26 LAB — HIV-1 RNA QUANT-NO REFLEX-BLD
HIV 1 RNA Quant: NOT DETECTED {copies}/mL
HIV-1 RNA Quant, Log: NOT DETECTED {Log_copies}/mL

## 2023-10-26 LAB — T-HELPER CELLS (CD4) COUNT (NOT AT ARMC)
Absolute CD4: 1708 {cells}/uL (ref 490–1740)
CD4 T Helper %: 44 % (ref 30–61)
Total lymphocyte count: 3848 {cells}/uL (ref 850–3900)

## 2023-10-26 LAB — PSA: PSA: 0.37 ng/mL (ref <=4.00)

## 2023-11-08 ENCOUNTER — Ambulatory Visit: Payer: Self-pay | Admitting: Infectious Diseases

## 2023-11-14 ENCOUNTER — Other Ambulatory Visit: Payer: Self-pay | Admitting: Infectious Diseases

## 2023-11-14 DIAGNOSIS — I10 Essential (primary) hypertension: Secondary | ICD-10-CM

## 2023-11-16 ENCOUNTER — Other Ambulatory Visit: Payer: Self-pay | Admitting: Pharmacist

## 2023-11-16 DIAGNOSIS — I10 Essential (primary) hypertension: Secondary | ICD-10-CM

## 2023-11-19 ENCOUNTER — Other Ambulatory Visit (HOSPITAL_COMMUNITY): Payer: Self-pay

## 2023-12-15 ENCOUNTER — Other Ambulatory Visit: Payer: Self-pay

## 2023-12-15 ENCOUNTER — Other Ambulatory Visit (HOSPITAL_COMMUNITY): Payer: Self-pay

## 2023-12-15 ENCOUNTER — Other Ambulatory Visit: Payer: Self-pay | Admitting: Pharmacist

## 2023-12-15 DIAGNOSIS — B20 Human immunodeficiency virus [HIV] disease: Secondary | ICD-10-CM

## 2023-12-15 MED ORDER — CABOTEGRAVIR & RILPIVIRINE ER 600 & 900 MG/3ML IM SUER
1.0000 | INTRAMUSCULAR | 5 refills | Status: AC
  Filled 2023-12-15: qty 6, 60d supply, fill #0
  Filled 2024-02-01: qty 6, 60d supply, fill #1
  Filled 2024-04-03: qty 6, 60d supply, fill #2

## 2023-12-15 NOTE — Progress Notes (Signed)
 Specialty Pharmacy Refill Coordination Note  Clayton Erickson is a 43 y.o. male assessed today regarding refills of clinic administered specialty medication(s) Cabotegravir  & Rilpivirine  (CABENUVA )   Clinic requested Courier to Provider Office   Delivery date: 12/20/23   Verified address: [address] [place] [address]   Medication will be filled on 12/17/23.

## 2023-12-16 ENCOUNTER — Other Ambulatory Visit: Payer: Self-pay

## 2023-12-20 ENCOUNTER — Telehealth: Payer: Self-pay

## 2023-12-20 NOTE — Telephone Encounter (Signed)
 [facility] Patient Advocate Encounter  Patient's medications Cabenuva  have been couriered to [facility] from [facility] and will be administered at the patients appointment on 12/24/23.  Arland Hutchinson, CPhT Specialty Pharmacy Patient [facility] for Infectious Disease Phone: [phone] Fax:  [phone]

## 2023-12-21 NOTE — Progress Notes (Deleted)
 HPI: Clayton Erickson is a 43 y.o. male who presents to the [facility] pharmacy clinic for Cabenuva  administration.  Referring ID Physician: Corean Fireman, NP  Patient Active Problem List   Diagnosis Date Noted   Irritability 10/21/2021   Knee pain, bilateral 02/12/2019   Low vitamin D  level 12/15/2017   Essential hypertension 08/10/2008   Anxiety state 11/18/2007   History of syphilis 10/28/2007   Human immunodeficiency virus (HIV) disease (HCC) 03/10/2006    Patient's Medications  New Prescriptions   No medications on file  Previous Medications   AMLODIPINE -VALSARTAN  (EXFORGE ) 10-160 MG TABLET    TAKE 1 TABLET BY MOUTH DAILY   AZELASTINE  (ASTELIN ) 0.1 % NASAL SPRAY    Place 1 spray into both nostrils 2 (two) times daily. Use in each nostril as directed   BUSPIRONE  (BUSPAR ) 10 MG TABLET    TAKE 1 TABLET(10 MG) BY MOUTH THREE TIMES DAILY AS NEEDED   CABOTEGRAVIR  & RILPIVIRINE  ER (CABENUVA ) 600 & 900 MG/3ML INJECTION    Inject 1 kit into the muscle every 2 (two) months.   FEXOFENADINE  (ALLEGRA ) 60 MG TABLET    Take 1 tablet (60 mg total) by mouth 2 (two) times daily.   SERTRALINE  (ZOLOFT ) 25 MG TABLET    TAKE 1 TABLET(25 MG) BY MOUTH DAILY  Modified Medications   No medications on file  Discontinued Medications   No medications on file    Allergies: No Known Allergies  Past Medical History: Past Medical History:  Diagnosis Date   Boils    Decreased thyroid stimulating hormone (TSH) level 01/2019   HIV infection (HCC)    Hypertension    Low vitamin D  level    Vitamin D  deficiency 01/2019    Social History: Social History   Socioeconomic History   Marital status: Single    Spouse name: Not on file   Number of children: Not on file   Years of education: Not on file   Highest education level: Not on file  Occupational History   Not on file  Tobacco Use   Smoking status: Former    Types: Cigarettes   Smokeless tobacco: Never   Tobacco comments:    Vapes  non-nicotine   Vaping Use   Vaping status: Not on file  Substance and Sexual Activity   Alcohol use: Not Currently    Comment: occ   Drug use: Yes    Frequency: 30.0 times per week    Types: Marijuana   Sexual activity: Yes    Partners: Male  Other Topics Concern   Not on file  Social History Narrative   Not on file   Social Drivers of Health   Financial Resource Strain: Not on file  Food Insecurity: Not on file  Transportation Needs: Not on file  Physical Activity: Not on file  Stress: Not on file  Social Connections: Not on file    Labs: Lab Results  Component Value Date   HIV1RNAQUANT NOT DETECTED 10/22/2023   HIV1RNAQUANT NOT DETECTED 07/07/2023   HIV1RNAQUANT Not Detected 02/19/2023   CD4TABS 1,243 07/07/2023   CD4TABS 1,112 04/22/2022   CD4TABS 1,306 10/31/2020    RPR and STI Lab Results  Component Value Date   LABRPR NON-REACTIVE 02/19/2023   LABRPR NON-REACTIVE 10/31/2020   LABRPR NON-REACTIVE 02/15/2020   LABRPR NON-REACTIVE 07/07/2018   LABRPR NON-REACTIVE 01/25/2018    STI Results GC CT  12/22/2022  2:21 PM Negative  Negative   10/21/2022  2:07 PM Negative  Negative  06/30/2022  9:15 AM Negative  Negative   02/15/2020 10:53 AM Negative  Negative   07/07/2018 12:00 AM Negative  Negative   01/25/2018 12:00 AM Negative  Negative   12/13/2017 12:00 AM Negative    Negative  Negative    Negative   09/06/2014 12:00 AM Negative  Negative   05/16/2014 12:00 AM NG: Negative  CT: Negative     Hepatitis B Lab Results  Component Value Date   HEPBSAB REACTIVE (A) 06/24/2021   HEPBSAG NON-REACTIVE 06/24/2021   Hepatitis C No results found for: HEPCAB, HCVRNAPCRQN Hepatitis A Lab Results  Component Value Date   HAV REACTIVE (A) 06/24/2021   Lipids: Lab Results  Component Value Date   CHOL 177 05/05/2021   TRIG 93 05/05/2021   HDL 73 05/05/2021   CHOLHDL 2.4 05/05/2021   VLDL 13 09/06/2014   LDLCALC 85 05/05/2021    TARGET DATE:   The 27th of the month  Assessment: Clayton Erickson presents today for their maintenance Cabenuva  injections. Initial/past injections were tolerated well without issues. No problems with systemic effects of injections.   Administered cabotegravir  600mg /19mL in left upper outer quadrant of the gluteal muscle. Administered rilpivirine  900 mg/3mL in the right upper outer quadrant of the gluteal muscle. Monitored patient for 10 minutes after injection. Injections were tolerated well without issue. Patient will follow up in 2 months for next injection. Due for lipid panel and CBC on next viral load check in 4 months.  Eligible for 3/3 HPV, flu, COVID, and Shingles vaccinations; ***.   Plan: - Cabenuva  injections administered - Next injections scheduled for *** with ***  - Call with any issues or questions  Alan Geralds, PharmD, CPP, BCIDP, AAHIVP Clinical Pharmacist Practitioner Infectious Diseases Clinical Pharmacist [facility]

## 2023-12-23 ENCOUNTER — Encounter (INDEPENDENT_AMBULATORY_CARE_PROVIDER_SITE_OTHER): Payer: Self-pay

## 2023-12-24 ENCOUNTER — Encounter: Payer: Self-pay | Admitting: Infectious Diseases

## 2023-12-24 ENCOUNTER — Ambulatory Visit: Payer: Self-pay | Admitting: Pharmacist

## 2023-12-24 ENCOUNTER — Other Ambulatory Visit: Payer: Self-pay

## 2023-12-24 ENCOUNTER — Ambulatory Visit (INDEPENDENT_AMBULATORY_CARE_PROVIDER_SITE_OTHER): Admitting: Infectious Diseases

## 2023-12-24 VITALS — BP 157/111 | HR 94 | Temp 98.7°F | Ht 71.0 in | Wt 208.0 lb

## 2023-12-24 DIAGNOSIS — Z79899 Other long term (current) drug therapy: Secondary | ICD-10-CM

## 2023-12-24 DIAGNOSIS — Z23 Encounter for immunization: Secondary | ICD-10-CM | POA: Diagnosis not present

## 2023-12-24 DIAGNOSIS — F411 Generalized anxiety disorder: Secondary | ICD-10-CM | POA: Diagnosis not present

## 2023-12-24 DIAGNOSIS — B20 Human immunodeficiency virus [HIV] disease: Secondary | ICD-10-CM

## 2023-12-24 DIAGNOSIS — I10 Essential (primary) hypertension: Secondary | ICD-10-CM

## 2023-12-24 DIAGNOSIS — B349 Viral infection, unspecified: Secondary | ICD-10-CM

## 2023-12-24 MED ORDER — FEXOFENADINE HCL 60 MG PO TABS: 60.0000 mg | ORAL_TABLET | Freq: Two times a day (BID) | ORAL | 1 refills | Status: AC

## 2023-12-24 MED ORDER — CABOTEGRAVIR & RILPIVIRINE ER 600 & 900 MG/3ML IM SUER
1.0000 | Freq: Once | INTRAMUSCULAR | Status: AC
  Administered 2023-12-24: 1 via INTRAMUSCULAR

## 2023-12-24 MED ORDER — SERTRALINE HCL 25 MG PO TABS: 25.0000 mg | ORAL_TABLET | Freq: Every day | ORAL | 4 refills | Status: AC

## 2023-12-24 NOTE — Patient Instructions (Signed)
 Take your sertraline  at night

## 2023-12-24 NOTE — Progress Notes (Signed)
 Name: Clayton Erickson  DOB: 1980/10/04 MRN: [id number] PCP: Delores Corean Pollen, MD     Brief Narrative:  Clayton Erickson is a 43 y.o. man with HIV infection that was originally diagnosed in 2002. His CD4 nadir is above 200. History of OIs: none.  HIV Risk: MSM.  Previously in care at [facility], [place] [phone]  Previous Regimens: Complera 2016 >> suppressed  Odefsey  2018 (poor adherence) Biktarvy  11-2018  Cabenuva  04-2021  Genotypes: 10-2017 >> no RT, PI or INSTI resistance   Subjective   Subjective:   Chief Complaint  Patient presents with   Follow-up    Headaches, out of BP meds a few days Burning chest pain Stress with work and family     Discussed the use of AI scribe software for clinical note transcription with the patient, who gave verbal consent to proceed.  History of Present Illness   Clayton Erickson is a 43 year old male with HIV who presents for routine follow-up care and Cabenuva  administration.  He is experiencing increased stress due to situational stressors at home and work. His work environment is highly stressful, with a difficult boss and challenging customer interactions, prompting him to seek assistance from HR. He is also dealing with family stressors, particularly concerning his grandfather's health, and finds it difficult to cope with the loss of his grandmother and three uncles. He is involved in caring for his grandfather, which adds to his stress.  He reports experiencing cold chills and chest pains recently. No fever or additional respiratory symptoms. He has not taken his Buspar  due to headaches but continues to take sertraline , which causes drowsiness.  He has applied for multiple jobs due to dissatisfaction with his current employment situation. 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     Review of  Systems  All other systems reviewed and are negative.   Past Medical History:  Diagnosis Date   Boils    Decreased thyroid stimulating hormone (TSH) level 01/2019   HIV infection (HCC)    Hypertension    Low vitamin D  level    Vitamin D  deficiency 01/2019    Outpatient Medications Prior to Visit  Medication Sig Dispense Refill   cabotegravir  & rilpivirine  ER (CABENUVA ) 600 & 900 MG/3ML injection Inject 1 kit into the muscle every 2 (two) months. 6 mL 5   sertraline  (ZOLOFT ) 25 MG tablet TAKE 1 TABLET(25 MG) BY MOUTH DAILY 30 tablet 2   amLODipine -valsartan  (EXFORGE ) 10-160 MG tablet TAKE 1 TABLET BY MOUTH DAILY (Patient not taking: Reported on 12/24/2023) 30 tablet 11   azelastine  (ASTELIN ) 0.1 % nasal spray Place 1 spray into both nostrils 2 (two) times daily. Use in each nostril as directed 30 mL 1   busPIRone  (BUSPAR ) 10 MG tablet TAKE 1 TABLET(10 MG) BY MOUTH THREE TIMES DAILY AS NEEDED (Patient not taking: Reported on 12/24/2023) 90 tablet 2   fexofenadine  (ALLEGRA ) 60 MG tablet Take 1 tablet (60 mg total) by mouth 2 (two) times daily. (Patient not taking: Reported on 12/24/2023) 60 tablet 1   No facility-administered medications prior to visit.    No Known Allergies   Social History   Substance and Sexual Activity  Sexual Activity Yes   Partners: Male      Objective   Objective:   Vitals:   12/24/23 0908 12/24/23 0945  BP: (!) 164/117 (!) 157/111  Pulse: 94  Temp: 98.7 F (37.1 C)   TempSrc: Oral   SpO2: 94%   Weight: 208 lb (94.3 kg)   Height: 5' 11 (1.803 m)    Body mass index is 29.01 kg/m.   Physical Exam Constitutional:      Appearance: Normal appearance. He is not ill-appearing.  HENT:     Head: Normocephalic.     Mouth/Throat:     Mouth: Mucous membranes are moist.     Pharynx: Oropharynx is clear.  Eyes:     General: No scleral icterus. Pulmonary:     Effort: Pulmonary effort is normal.     Breath sounds: Normal breath sounds.   Abdominal:     General: Abdomen is flat.     Palpations: Abdomen is soft.  Musculoskeletal:        General: Normal range of motion.     Cervical back: Normal range of motion.  Skin:    Coloration: Skin is not jaundiced or pale.  Neurological:     Mental Status: He is alert and oriented to person, place, and time.  Psychiatric:        Mood and Affect: Mood normal.        Judgment: Judgment normal.     Lab Results Lab Results  Component Value Date   WBC 9.8 05/05/2021   HGB 14.8 05/05/2021   HCT 44.5 05/05/2021   MCV 82.6 05/05/2021   PLT 253 05/05/2021    Lab Results  Component Value Date   CREATININE 0.95 10/22/2023   BUN 10 10/22/2023   NA 132 (L) 10/22/2023   K 4.3 10/22/2023   CL 94 (L) 10/22/2023   CO2 25 10/22/2023    Lab Results  Component Value Date   ALT 21 10/22/2023   AST 21 10/22/2023   ALKPHOS 55 09/06/2014   BILITOT 0.7 10/22/2023    Lab Results  Component Value Date   CHOL 177 05/05/2021   HDL 73 05/05/2021   LDLCALC 85 05/05/2021   TRIG 93 05/05/2021   CHOLHDL 2.4 05/05/2021   HIV 1 RNA Quant  Date Value  10/22/2023 NOT DETECTED copies/mL  07/07/2023 NOT DETECTED copies/mL  02/19/2023 Not Detected Copies/mL   CD4 T Cell Abs (/uL)  Date Value  07/07/2023 1,243  04/22/2022 1,112  10/31/2020 1,306    The 10-year ASCVD risk score (Arnett DK, et al., 2019) is: 8.1%   Values used to calculate the score:     Age: 76 years     Clincally relevant sex: Male     Is Non-Hispanic African American: Yes     Diabetic: No     Tobacco smoker: No     Systolic Blood Pressure: 157 mmHg     Is BP treated: Yes     HDL Cholesterol: 73 mg/dL     Total Cholesterol: 177 mg/dL   On Statin Therapy - yes   Anal Cancer Screen:     Component Value Date/Time   DIAGPAP  10/21/2022 1409    - Negative for intraepithelial lesion or malignancy (NILM)   ADEQPAP Satisfactory for evaluation. 10/21/2022 1409      Assessment & Plan:     Human  immunodeficiency virus (HIV) infection - Routine follow-up for HIV infection with no acute issues. - Administer Cabenuva  injection today (TD 27th) - Schedule next Cabenuva  injection for February 18, 2024 (one day early to fit in with me)   Generalized anxiety disorder - Exacerbation due to situational stressors at work and family issues, causing significant  stress and difficulty managing emotions. Sertraline  causes daytime sedation, and Buspar  causes headaches.  - Complete work accommodation paperwork for time off from October 21 to December 27, 2023, and allow for five mental health days per month. - Refer to [facility] for counseling, available on Wednesdays or at his other office for more flexibility. - Instruct to take sertraline  at bedtime to reduce daytime sedation. - Discontinue Buspar  due to headaches --> consider hydroxyzine  if PRN needed or maybe propanolol low dose  - Provide refills for sertraline .  Essential hypertension - Not currently taking blood pressure medication, emphasizing the importance of adherence for blood pressure control. - Instruct to resume blood pressure medication. - Discuss the potential benefit of blood pressure control on chest symptoms.       Meds ordered this encounter  Medications   cabotegravir  & rilpivirine  ER (CABENUVA ) 600 & 900 MG/3ML injection 1 kit   sertraline  (ZOLOFT ) 25 MG tablet    Sig: Take 1 tablet (25 mg total) by mouth at bedtime.    Dispense:  30 tablet    Refill:  4   fexofenadine  (ALLEGRA ) 60 MG tablet    Sig: Take 1 tablet (60 mg total) by mouth 2 (two) times daily.    Dispense:  60 tablet    Refill:  1   Orders Placed This Encounter  Procedures   Flu vaccine trivalent PF, 6mos and older(Flulaval,Afluria,Fluarix,Fluzone)   02/18/2024  next injection appt TD 27 Even       Corean Fireman, MSN, NP-C [facility] for Infectious Disease [facility]  [email or web address] Pager: [phone] Office:  [phone] [facility] Main Line: [phone]    12/29/23  2:43 PM

## 2024-02-01 ENCOUNTER — Other Ambulatory Visit (HOSPITAL_COMMUNITY): Payer: Self-pay

## 2024-02-01 ENCOUNTER — Other Ambulatory Visit: Payer: Self-pay

## 2024-02-01 NOTE — Progress Notes (Signed)
 Specialty Pharmacy Refill Coordination Note  Clayton Erickson is a 43 y.o. male assessed today regarding refills of clinic administered specialty medication(s) Cabotegravir  & Rilpivirine  (CABENUVA )   Clinic requested Courier to Provider Office   Delivery date: 02/10/24   Verified address: [address] [place] [address]   Medication will be filled on 02/09/24.

## 2024-02-09 ENCOUNTER — Other Ambulatory Visit: Payer: Self-pay

## 2024-02-10 ENCOUNTER — Telehealth: Payer: Self-pay

## 2024-02-10 NOTE — Telephone Encounter (Signed)
 [facility] Patient Advocate Encounter  Patient's medications CABENUVA  have been couriered to [facility] from [facility] and will be administered at the patients appointment on 02/18/24.  Charmaine Sharps, CPhT Specialty Pharmacy Patient [facility] for Infectious Disease Phone: [phone] Fax:  [phone]

## 2024-02-11 ENCOUNTER — Other Ambulatory Visit (HOSPITAL_COMMUNITY): Payer: Self-pay

## 2024-02-16 ENCOUNTER — Telehealth: Payer: Self-pay

## 2024-02-16 ENCOUNTER — Other Ambulatory Visit (HOSPITAL_COMMUNITY): Payer: Self-pay

## 2024-02-16 NOTE — Telephone Encounter (Signed)
 Pharmacy Patient Advocate Encounter   Received notification from [employer] that prior authorization for CABENUVA  is required/requested.   Insurance verification completed.   The patient is insured through [facility].   Per test claim: PA required; PA submitted to above mentioned insurance via Lat[employer] Key/confirmation #/EOC [id number] Status is pending

## 2024-02-16 NOTE — Telephone Encounter (Signed)
 ERROR

## 2024-02-17 NOTE — Telephone Encounter (Addendum)
 Pharmacy Patient Advocate Encounter- Cabenuva  BIV-Pharmacy Benefit:  PA was submitted to [employer] and has been approved through: 02/16/24 - 02/15/25 Authorization# [id number]  Please send prescription to Specialty Pharmacy: [facility]: [phone]  Estimated Copay is: $0

## 2024-02-18 ENCOUNTER — Ambulatory Visit: Admitting: Infectious Diseases

## 2024-02-18 ENCOUNTER — Other Ambulatory Visit: Payer: Self-pay

## 2024-02-18 ENCOUNTER — Encounter: Payer: Self-pay | Admitting: Infectious Diseases

## 2024-02-18 VITALS — BP 158/106 | HR 82 | Temp 98.1°F | Ht 71.0 in | Wt 211.8 lb

## 2024-02-18 DIAGNOSIS — H00011 Hordeolum externum right upper eyelid: Secondary | ICD-10-CM

## 2024-02-18 DIAGNOSIS — B20 Human immunodeficiency virus [HIV] disease: Secondary | ICD-10-CM | POA: Diagnosis not present

## 2024-02-18 DIAGNOSIS — F418 Other specified anxiety disorders: Secondary | ICD-10-CM

## 2024-02-18 DIAGNOSIS — L309 Dermatitis, unspecified: Secondary | ICD-10-CM

## 2024-02-18 DIAGNOSIS — I10 Essential (primary) hypertension: Secondary | ICD-10-CM

## 2024-02-18 MED ORDER — TRIAMCINOLONE ACETONIDE 0.1 % EX CREA: 1.0000 | TOPICAL_CREAM | Freq: Two times a day (BID) | CUTANEOUS | 2 refills | Status: AC

## 2024-02-18 MED ORDER — POLYMYXIN B-TRIMETHOPRIM 10000-0.1 UNIT/ML-% OP SOLN: 2.0000 [drp] | OPHTHALMIC | 0 refills | Status: AC

## 2024-02-18 MED ORDER — CABOTEGRAVIR & RILPIVIRINE ER 600 & 900 MG/3ML IM SUER
1.0000 | Freq: Once | INTRAMUSCULAR | Status: AC
  Administered 2024-02-18: 1 via INTRAMUSCULAR

## 2024-02-18 NOTE — Patient Instructions (Addendum)
" ° °  Please schedule your next appointment February 20th with me or pharmacy.   Eucerine, Coconut oil, Aveeno. Going treat this like an eczema to see how that does   -HORDEOLUM OF RIGHT UPPER EYELID: A hordeolum, or sty, is a painful lump on the eyelid caused by a blocked gland. You have been prescribed Polytrim  drops to use every 4 hours in your right eye, advised to use alternating cool compresses for itching symptoms and warm compresses for the management of the stye discomfort; keep your contacts out until it resolves. Seek urgent care if the swelling worsens.  -DERMATITIS: Dermatitis is an inflammation of the skin that causes itching and rash. You have been prescribed triamcinolone  ointment to apply to the itchy spots twice daily. Use moisturizers like Eucerin, Aveeno, or Vaseline, keep your showers short and not too hot, pat dry, and apply lotion while your skin is still damp. "

## 2024-02-18 NOTE — Progress Notes (Signed)
 "  Name: Clayton Erickson  DOB: September 23, 1980 MRN: [id number] PCP: Delores Corean Pollen, MD     Brief Narrative:  Clayton Erickson is a 43 y.o. man with HIV infection that was originally diagnosed in 2002. His CD4 nadir is above 200. History of OIs: none.  HIV Risk: MSM.  Previously in care at [facility], [place] E#[phone]  Previous Regimens: Complera 2016 >> suppressed  Odefsey 2018 (poor adherence) Biktarvy  11-2018  Cabenuva  04-2021  Genotypes: 10-2017 >> no RT, PI or INSTI resistance   Subjective   Subjective:   Chief Complaint  Patient presents with   Follow-up    Eye stye right x1 week. No relief with warm compress.     Discussed the use of AI scribe software for clinical note transcription with the patient, who gave verbal consent to proceed.  History of Present Illness   Clayton Erickson is a 43 year old male with HIV who presents for follow-up care and Cabenuva  administration.  His last dose of Cabenuva  was administered on October 24th. He has not been taking his blood pressure medications regularly as prescribed. His last set of labs were drawn in August.  He has a sty in his right eye for about a week, which is 'puffy' and causes itching. He has been using warm compresses. He experiences crusting in the morning. He has stopped wearing contacts for the interim.   He describes experiencing stress related to his job, which he finds to be a 'rollercoaster ride' with some manageable days and others not. 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  He has noticed itchy, bite-like rashes on his buttocks, back, knee, and wrist. He initially thought they were bed bug bites but is unsure as his partner does not have any bites. He has not changed soaps or detergents recently.     Review of Systems  All other systems reviewed and are negative.   Past Medical History:  Diagnosis Date   Boils    Decreased thyroid  stimulating hormone (TSH) level 01/2019   HIV infection (HCC)    Hypertension    Low vitamin D  level    Vitamin D  deficiency 01/2019    Outpatient Medications Prior to Visit  Medication Sig Dispense Refill   azelastine  (ASTELIN ) 0.1 % nasal spray Place 1 spray into both nostrils 2 (two) times daily. Use in each nostril as directed 30 mL 1   cabotegravir  & rilpivirine  ER (CABENUVA ) 600 & 900 MG/3ML injection Inject 1 kit into the muscle every 2 (two) months. 6 mL 5   sertraline  (ZOLOFT ) 25 MG tablet Take 1 tablet (25 mg total) by mouth at bedtime. 30 tablet 4   amLODipine -valsartan  (EXFORGE ) 10-160 MG tablet TAKE 1 TABLET BY MOUTH DAILY (Patient not taking: Reported on 12/24/2023) 30 tablet 11   fexofenadine  (ALLEGRA ) 60 MG tablet Take 1 tablet (60 mg total) by mouth 2 (two) times daily. (Patient not taking: Reported on 12/24/2023) 60 tablet 1   No facility-administered medications prior to visit.    No Known Allergies   Social History   Substance and Sexual Activity  Sexual Activity Yes   Partners: Male      Objective   Objective:   Vitals:   02/18/24 0915  BP: (!) 158/106  Pulse: 82  Temp: 98.1 F (36.7 C)  TempSrc: Oral  SpO2: 94%  Weight: 211 lb 12.8 oz (96.1 kg)  Height: 5' 11 (1.803 m)   Body mass index  is 29.54 kg/m.   Physical Exam Constitutional:      Appearance: Normal appearance. He is not ill-appearing.  HENT:     Head: Normocephalic.     Mouth/Throat:     Mouth: Mucous membranes are moist.     Pharynx: Oropharynx is clear.  Eyes:     General: No scleral icterus.       Right eye: Discharge present. No foreign body.     Extraocular Movements: Extraocular movements intact.     Conjunctiva/sclera:     Right eye: Right conjunctiva is injected. No exudate. Pulmonary:     Effort: Pulmonary effort is normal.     Breath sounds: Normal breath sounds.  Abdominal:     General: Abdomen is flat.     Palpations: Abdomen is soft.  Musculoskeletal:         General: Normal range of motion.     Cervical back: Normal range of motion.  Skin:    Coloration: Skin is not jaundiced or pale.  Neurological:     Mental Status: He is alert and oriented to person, place, and time.  Psychiatric:        Mood and Affect: Mood normal.        Judgment: Judgment normal.     Lab Results Lab Results  Component Value Date   WBC 9.8 05/05/2021   HGB 14.8 05/05/2021   HCT 44.5 05/05/2021   MCV 82.6 05/05/2021   PLT 253 05/05/2021    Lab Results  Component Value Date   CREATININE 0.95 10/22/2023   BUN 10 10/22/2023   NA 132 (L) 10/22/2023   K 4.3 10/22/2023   CL 94 (L) 10/22/2023   CO2 25 10/22/2023    Lab Results  Component Value Date   ALT 21 10/22/2023   AST 21 10/22/2023   ALKPHOS 55 09/06/2014   BILITOT 0.7 10/22/2023    Lab Results  Component Value Date   CHOL 177 05/05/2021   HDL 73 05/05/2021   LDLCALC 85 05/05/2021   TRIG 93 05/05/2021   CHOLHDL 2.4 05/05/2021   HIV 1 RNA Quant  Date Value  10/22/2023 NOT DETECTED copies/mL  07/07/2023 NOT DETECTED copies/mL  02/19/2023 Not Detected Copies/mL   CD4 T Cell Abs (/uL)  Date Value  07/07/2023 1,243  04/22/2022 1,112  10/31/2020 1,306    The 10-year ASCVD risk score (Arnett DK, et al., 2019) is: 8.2%   Values used to calculate the score:     Age: 50 years     Clinically relevant sex: Male     Is Non-Hispanic African American: Yes     Diabetic: No     Tobacco smoker: No     Systolic Blood Pressure: 158 mmHg     Is BP treated: Yes     HDL Cholesterol: 73 mg/dL     Total Cholesterol: 177 mg/dL   On Statin Therapy - yes   Anal Cancer Screen:     Component Value Date/Time   DIAGPAP  10/21/2022 1409    - Negative for intraepithelial lesion or malignancy (NILM)   ADEQPAP Satisfactory for evaluation. 10/21/2022 1409      Assessment & Plan:     Human immunodeficiency virus (HIV) infection - HIV infection managed with Cabenuva . Last dose administered in October.  Patient reports new symptoms of itching and bumps on buttocks, back, and wrist. - labs reviewed from August and all as expected.  - will plan to repeat VL, CD4, BMP, LFT and CBC -  need to update anal cancer screen in Aug 2026. He deferred today.   Hordeolum of right upper eyelid - Hordeolum present for one week with crusting and itching. No signs of infection requiring systemic antibiotics. - Prescribed Polytrim drops every 4 hours for right eye - Advised cool compresses for itching - Instructed to keep contacts out until resolved - Advised to seek urgent care if swelling worsens  Dermatitis - Itchy rash on buttocks, back, knee, and wrist. Differential includes allergic reaction or folliculitis. Partner unaffected, reducing likelihood of infestation. No changes in personal care products from what he recalls. Nothing looks pustular on exam. More urticarial and open scratched areas. Does not seem c/w fungal infection. ?eczematous eruption.  - Prescribed triamcinolone ointment for itchy spots twice daily - Advised use of moisturizers like Eucerin, Aveeno, or Vaseline - Instructed to keep showers short and not too hot, pat dry, and apply lotion while skin is damp  Essential hypertension - Blood pressure elevated due to non-adherence to medication regimen. Acknowledges non-compliance and reasons for it. - Encouraged resumption of blood pressure medication as prescribed  Depression and anxiety - Managed with sertraline . Reports inconsistent use of sertraline  and hydroxyzine  as needed. Stress management discussed. - Encouraged consistent daily use of sertraline  - Advised to bring medication bag to next appointment for review or sent updated photo/message on mychart so we have a record of what he is on and what is working - Discussed stress management strategies       Meds ordered this encounter  Medications   trimethoprim -polymyxin b (POLYTRIM) ophthalmic solution    Sig: Place 2 drops into  the right eye every 4 (four) hours.    Dispense:  10 mL    Refill:  0   triamcinolone cream (KENALOG) 0.1 %    Sig: Apply 1 Application topically 2 (two) times daily.    Dispense:  30 g    Refill:  2   cabotegravir  & rilpivirine  ER (CABENUVA ) 600 & 900 MG/3ML injection 1 kit   No orders of the defined types were placed in this encounter.  04/21/2024 for next appt      This evaluation (history, exam, medical decision making) was significant and separately identifiable from the injection administration in the office today.   Corean Fireman, MSN, NP-C [facility]  [email or web address] Pager: [phone] Office: [phone] [facility] Main Line: [phone]    02/18/2024  11:24 AM   "

## 2024-04-03 ENCOUNTER — Other Ambulatory Visit (HOSPITAL_COMMUNITY): Payer: Self-pay

## 2024-04-03 ENCOUNTER — Other Ambulatory Visit: Payer: Self-pay

## 2024-04-03 NOTE — Progress Notes (Signed)
 Specialty Pharmacy Refill Coordination Note  Clayton Erickson is a 44 y.o. male assessed today regarding refills of clinic administered specialty medication(s) Cabotegravir  & Rilpivirine  (CABENUVA )   Clinic requested Courier to Provider Office   Delivery date: 04/17/24   Verified address: [address] [place]   Medication will be filled on 04/14/24.

## 2024-04-21 ENCOUNTER — Encounter: Payer: Self-pay | Admitting: Pharmacist
